# Patient Record
Sex: Male | Born: 1965
Health system: Southern US, Community
[De-identification: ages and names within clinical notes are randomized; demographics above are authoritative.]

## PROBLEM LIST (undated history)

## (undated) DIAGNOSIS — I10 Essential (primary) hypertension: Secondary | ICD-10-CM

## (undated) HISTORY — DX: Essential (primary) hypertension: I10

---

## 2005-10-26 ENCOUNTER — Emergency Department (HOSPITAL_COMMUNITY): Admission: EM | Admit: 2005-10-26 | Discharge: 2005-10-26 | Payer: Self-pay | Admitting: Emergency Medicine

## 2009-12-21 ENCOUNTER — Emergency Department (HOSPITAL_COMMUNITY): Admission: EM | Admit: 2009-12-21 | Discharge: 2009-12-21 | Payer: Self-pay | Admitting: Emergency Medicine

## 2010-10-05 LAB — URINALYSIS, ROUTINE W REFLEX MICROSCOPIC
Bilirubin Urine: NEGATIVE
Ketones, ur: 15 mg/dL — AB
Leukocytes, UA: NEGATIVE
Specific Gravity, Urine: 1.026 (ref 1.005–1.030)

## 2010-10-05 LAB — POCT I-STAT, CHEM 8
BUN: 29 mg/dL — ABNORMAL HIGH (ref 6–23)
Chloride: 106 mEq/L (ref 96–112)
HCT: 48 % (ref 39.0–52.0)
Hemoglobin: 16.3 g/dL (ref 13.0–17.0)
Sodium: 139 mEq/L (ref 135–145)

## 2011-05-30 IMAGING — CT CT ABD-PELV W/O CM
2 of 4 series · 17 of 46 positions shown, 19 images · non-contrast
Comparison: None.

CLINICAL DATA: Acute right flank pain with vomiting.  History of
kidney stones.

CT ABDOMEN AND PELVIS WITHOUT CONTRAST
TECHNIQUE: Multidetector CT imaging of the abdomen and pelvis was
performed following the standard protocol without intravenous
contrast.

[Series 2: a/p w/o 5.0 b31f st · axial · non-contrast · 0.71mm/px · z∈[-448,-8]mm · 14 of 98 slices shown, 16 images]
[im 5/98  soft-tissue]
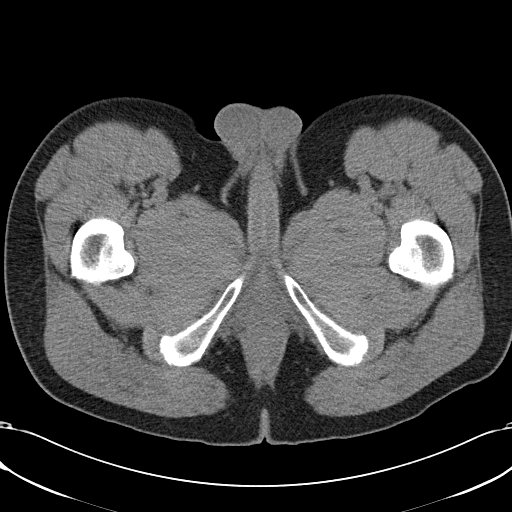
[im 5/98  bone]
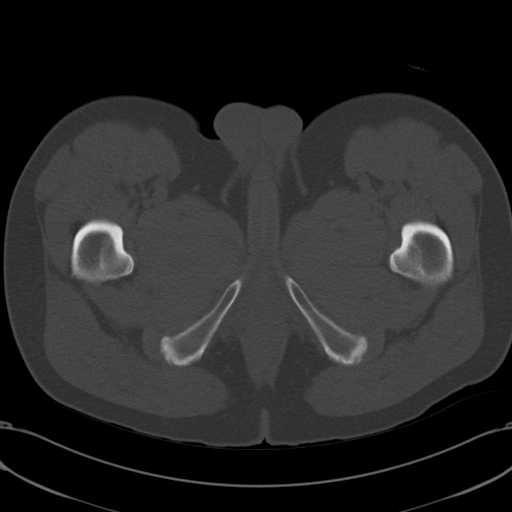
[im 13/98  soft-tissue]
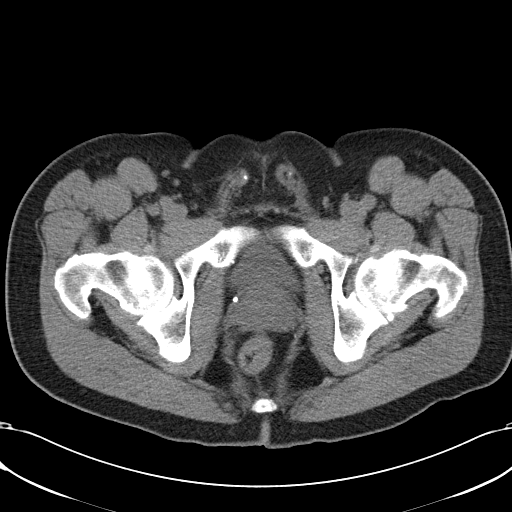
[im 17/98  soft-tissue]
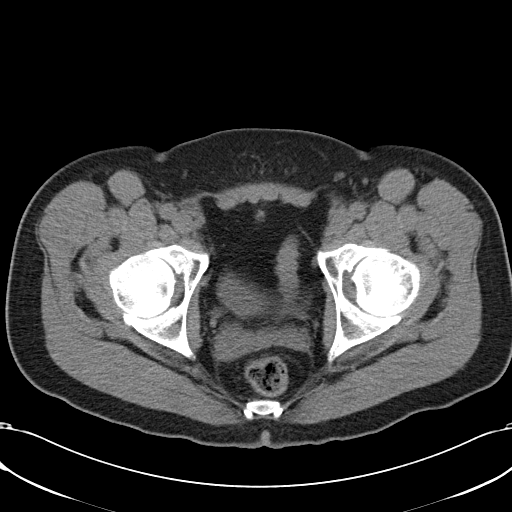
[im 26/98  soft-tissue]
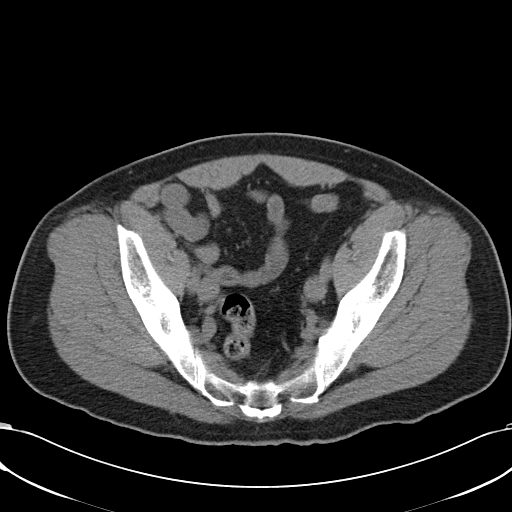
[im 34/98  soft-tissue]
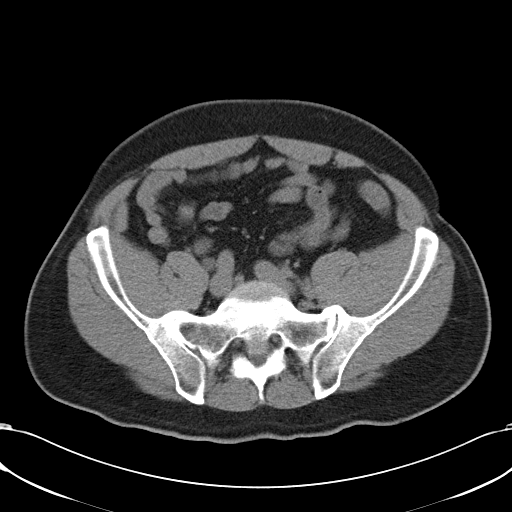
[im 38/98  soft-tissue]
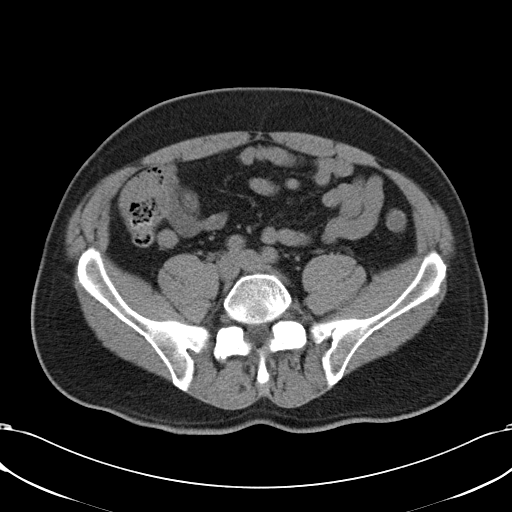
[im 47/98  soft-tissue]
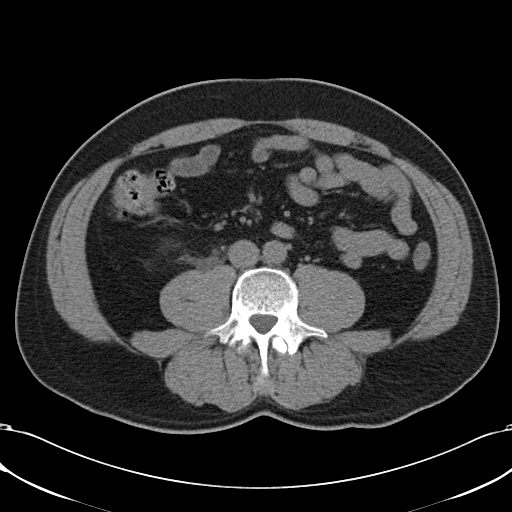
[im 51/98  soft-tissue]
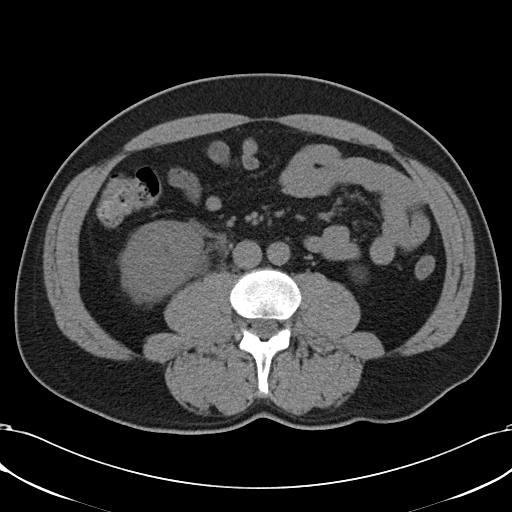
[im 60/98  soft-tissue]
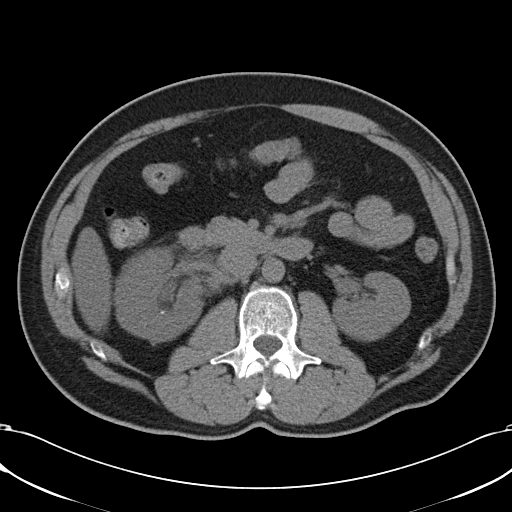
[im 60/98  bone]
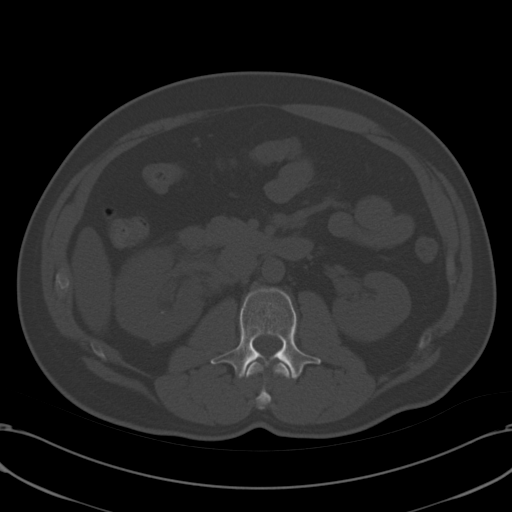
[im 64/98  soft-tissue]
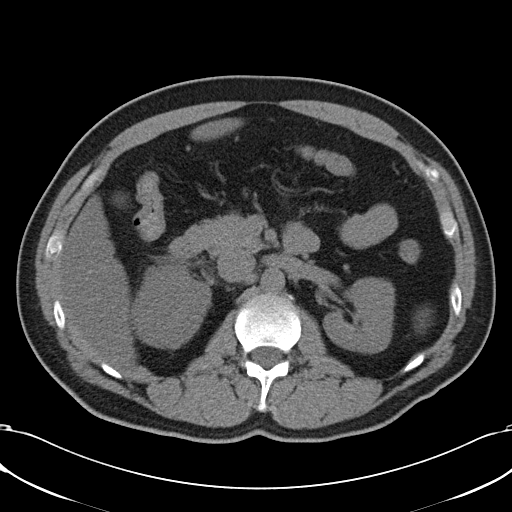
[im 72/98  soft-tissue]
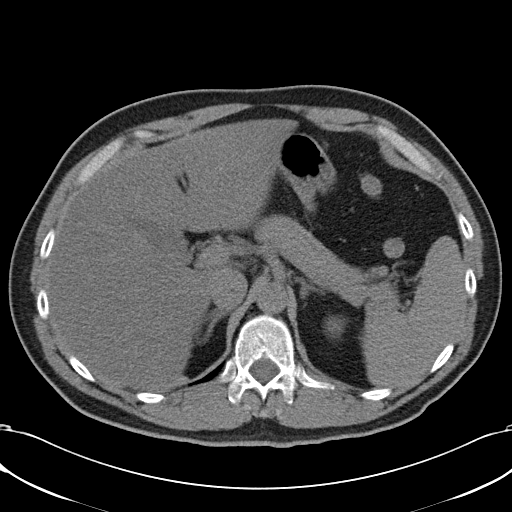
[im 81/98  soft-tissue]
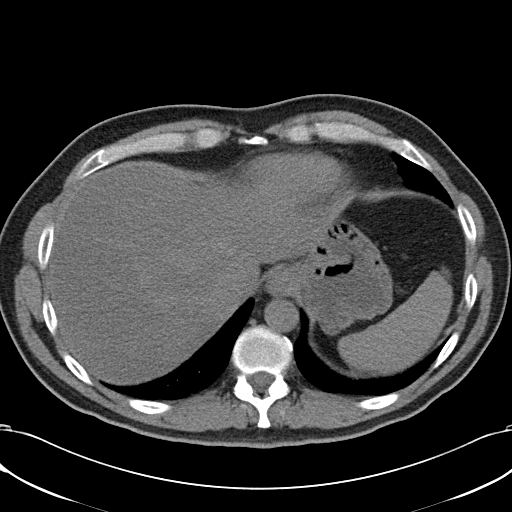
[im 85/98  soft-tissue]
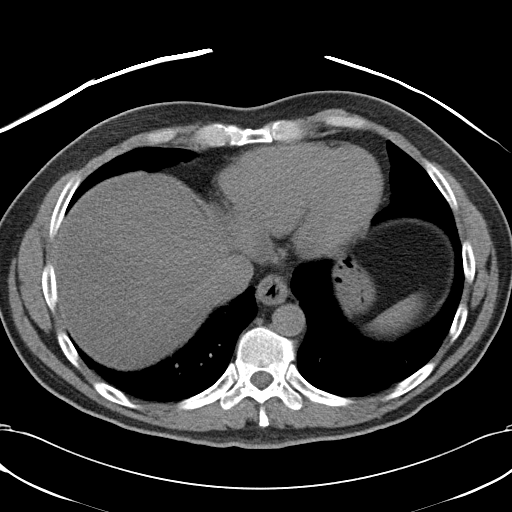
[im 93/98  soft-tissue]
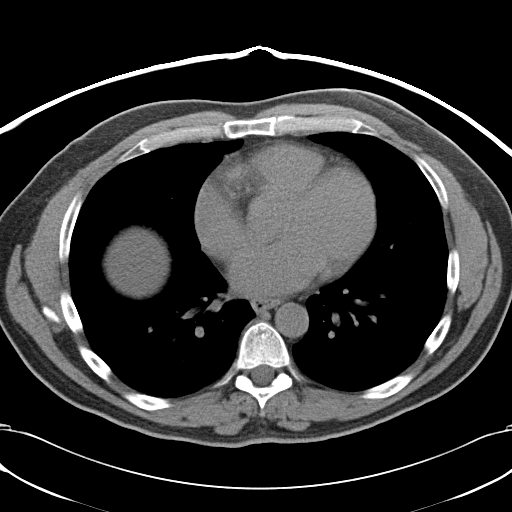

[Series 602: cor · coronal · 0.96mm/px · 3 of 79 slices shown]
[im 27/79  soft-tissue]
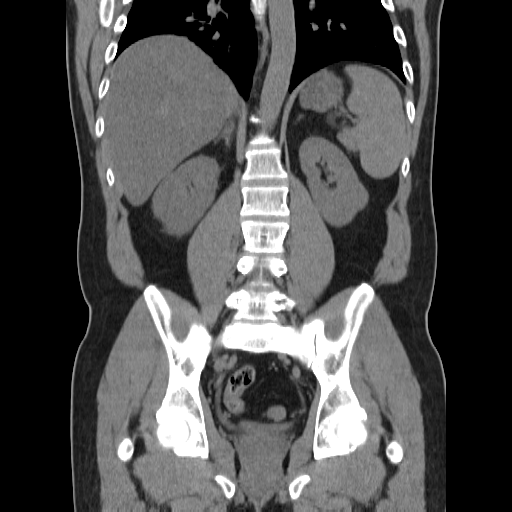
[im 35/79  soft-tissue]
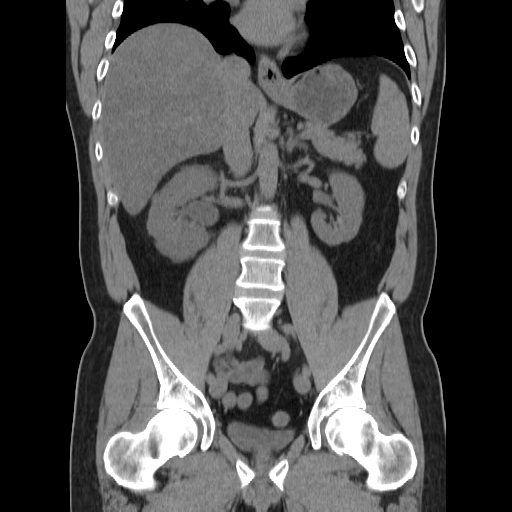
[im 44/79  soft-tissue]
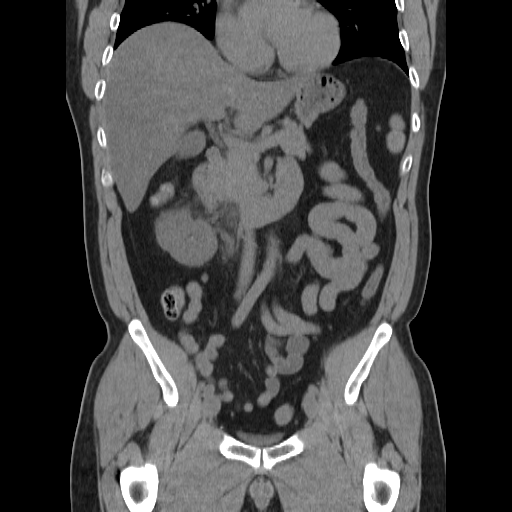

[17 of 46 positions shown; findings below may reference images not displayed]

FINDINGS: There is mild scarring or atelectasis in both lung bases.
There are small nonobstructing right renal calculi.  The right
kidney demonstrates moderate hydronephrosis and perinephric soft
tissue stranding.  There is a 3 mm calculus of the right ureteral
vesicle junction on image 83.  This protrudes into the bladder
lumen.  It is not clearly seen on the scout image.  There is no
left-sided hydronephrosis.  No focal renal abnormalities are
identified.

There is diffuse hepatic steatosis.  No focal liver lesions are
identified on noncontrast imaging. The remainder of the visualized
abdomen appears unremarkable as imaged in the noncontrast state,
but as such is suboptimally evaluated.
IMPRESSION: 1.  Obstructing 3 mm calculus at the right ureteral vesicle
junction.
2.  Additional small right renal calculi are present.
3.  Diffuse hepatic steatosis.

## 2013-06-08 ENCOUNTER — Ambulatory Visit (INDEPENDENT_AMBULATORY_CARE_PROVIDER_SITE_OTHER): Payer: 59 | Admitting: Family Medicine

## 2013-06-08 ENCOUNTER — Encounter (INDEPENDENT_AMBULATORY_CARE_PROVIDER_SITE_OTHER): Payer: Self-pay

## 2013-06-08 ENCOUNTER — Encounter: Payer: Self-pay | Admitting: Family Medicine

## 2013-06-08 VITALS — BP 143/93 | HR 99 | Temp 98.6°F | Ht 67.0 in | Wt 182.4 lb

## 2013-06-08 DIAGNOSIS — I10 Essential (primary) hypertension: Secondary | ICD-10-CM

## 2013-06-08 MED ORDER — HYDROCHLOROTHIAZIDE 25 MG PO TABS: 25.0000 mg | ORAL_TABLET | Freq: Every day | ORAL | Status: DC

## 2013-06-08 NOTE — Progress Notes (Signed)
  Subjective:    Patient ID: Dylan Ortiz, male    DOB: Apr 02, 1966, 47 y.o.   MRN: [id number]  HPI  This 47 y.o. male presents for evaluation of needing a med refill.  Review of Systems No chest pain, SOB, HA, dizziness, vision change, N/V, diarrhea, constipation, dysuria, urinary urgency or frequency, myalgias, arthralgias or rash.     Objective:   Physical Exam Vital signs noted  Well developed well nourished male.  HEENT - Head atraumatic Normocephalic                Eyes - PERRLA, Conjuctiva - clear Sclera- Clear EOMI                Ears - EAC's Wnl TM's Wnl Gross Hearing WNL                Nose - Nares patent                 Throat - oropharanx wnl Respiratory - Lungs CTA bilateral Cardiac - RRR S1 and S2 without murmur GI - Abdomen soft Nontender and bowel sounds active x 4 Extremities - No edema. Neuro - Grossly intact.       Assessment & Plan:  Hypertension - HCTZ 25mg  one po qd #30w/11 rf Follow up in one year  Deatra Canter FNP

## 2014-06-24 ENCOUNTER — Other Ambulatory Visit: Payer: Self-pay | Admitting: Family Medicine

## 2014-07-17 ENCOUNTER — Ambulatory Visit (INDEPENDENT_AMBULATORY_CARE_PROVIDER_SITE_OTHER): Payer: 59 | Admitting: Family Medicine

## 2014-07-17 VITALS — BP 116/69 | HR 63 | Temp 97.2°F | Ht 67.0 in | Wt 170.0 lb

## 2014-07-17 DIAGNOSIS — I10 Essential (primary) hypertension: Secondary | ICD-10-CM

## 2014-07-17 MED ORDER — HYDROCHLOROTHIAZIDE 25 MG PO TABS: 25.0000 mg | ORAL_TABLET | Freq: Every day | ORAL | Status: DC

## 2014-07-17 NOTE — Progress Notes (Signed)
   Subjective:    Patient ID: Dylan Ortiz, male    DOB: 09/24/1965, 48 y.o.   MRN: [id number]  HPI Patient is here for follow up.  He is taking HCTZ and needs refill.  He has lost weight and his bp runs 90's systolic at home when he checks it.  He has had labs at work and Lipid panel shows total chol of 134, HDL 56 and LDL - 67.  Trigs are wnl.  CMP is wnl, PSA is 0.4 and CBC normal.  He does not want colonoscopy.  He has no acute complaints.  Review of Systems  Constitutional: Negative for fever.  HENT: Negative for ear pain.   Eyes: Negative for discharge.  Respiratory: Negative for cough.   Cardiovascular: Negative for chest pain.  Gastrointestinal: Negative for abdominal distention.  Endocrine: Negative for polyuria.  Genitourinary: Negative for difficulty urinating.  Musculoskeletal: Negative for gait problem and neck pain.  Skin: Negative for color change and rash.  Neurological: Negative for speech difficulty and headaches.  Psychiatric/Behavioral: Negative for agitation.       Objective:    BP 116/69 mmHg  Pulse 63  Temp(Src) 97.2 F (36.2 C) (Oral)  Ht 5\' 7"  (1.702 m)  Wt 170 lb (77.111 kg)  BMI 26.62 kg/m2 Physical Exam  Constitutional: He is oriented to person, place, and time. He appears well-developed and well-nourished.  HENT:  Head: Normocephalic and atraumatic.  Mouth/Throat: Oropharynx is clear and moist.  Eyes: Pupils are equal, round, and reactive to light.  Neck: Normal range of motion. Neck supple.  Cardiovascular: Normal rate and regular rhythm.   No murmur heard. Pulmonary/Chest: Effort normal and breath sounds normal.  Abdominal: Soft. Bowel sounds are normal. There is no tenderness.  Neurological: He is alert and oriented to person, place, and time.  Skin: Skin is warm and dry.  Psychiatric: He has a normal mood and affect.          Assessment & Plan:     ICD-9-CM ICD-10-CM   1. Essential hypertension 401.9 I10 hydrochlorothiazide  (HYDRODIURIL) 25 MG tablet   Cut HCTZ in half and take bp in 3-4 weeks and need bp to be below 140/90 if not then go back to one po qd.  Return in about 1 year (around 07/18/2015).  Deatra CanterWilliam J Assyria Morreale FNP

## 2014-11-26 ENCOUNTER — Ambulatory Visit (INDEPENDENT_AMBULATORY_CARE_PROVIDER_SITE_OTHER): Payer: 59 | Admitting: Family Medicine

## 2014-11-26 ENCOUNTER — Encounter: Payer: Self-pay | Admitting: Family Medicine

## 2014-11-26 VITALS — BP 117/67 | HR 71 | Temp 97.2°F | Ht 67.0 in | Wt 170.0 lb

## 2014-11-26 DIAGNOSIS — K649 Unspecified hemorrhoids: Secondary | ICD-10-CM | POA: Diagnosis not present

## 2014-11-26 DIAGNOSIS — K644 Residual hemorrhoidal skin tags: Secondary | ICD-10-CM

## 2014-11-26 MED ORDER — NYSTATIN-TRIAMCINOLONE 100000-0.1 UNIT/GM-% EX OINT: 1.0000 "application " | TOPICAL_OINTMENT | Freq: Two times a day (BID) | CUTANEOUS | Status: DC

## 2014-11-26 NOTE — Progress Notes (Signed)
   Subjective:    Patient ID: Dylan Ortiz, male    DOB: 11/03/1965, 49 y.o.   MRN: [id number]  HPI 49 year old gentleman here with hemorrhoidal symptoms. He has not had pain or bleeding but his symptoms are mostly itching, much worse at night. He reports that last night he was awakened it 2 or 3:00 and has been awake since then with itching. He denies any problems with diarrhea or constipation. He has tried several over-the-counter medicines without relief. What has afforded some relief has been an ice cube.    Review of Systems  Constitutional: Negative.   HENT: Negative.   Eyes: Negative.   Respiratory: Negative.  Negative for shortness of breath.   Cardiovascular: Negative.  Negative for chest pain and leg swelling.  Gastrointestinal: Negative.  Abdominal distention: rectal pruritus.       Rectal pruritus  Genitourinary: Negative.   Musculoskeletal: Negative.   Skin: Negative.   Neurological: Negative.   Psychiatric/Behavioral: Negative.   All other systems reviewed and are negative.  There are no active problems to display for this patient.  Outpatient Encounter Prescriptions as of 11/26/2014  Medication Sig  . hydrochlorothiazide (HYDRODIURIL) 25 MG tablet Take 1 tablet (25 mg total) by mouth daily.  Marland Kitchen. nystatin-triamcinolone ointment (MYCOLOG) Apply 1 application topically 2 (two) times daily.   No facility-administered encounter medications on file as of 11/26/2014.       Objective:   Physical Exam  Constitutional: He appears well-developed and well-nourished.  Genitourinary:  There is general inflammation of the rectal mucosa as well as inflamed hemorrhoidal tag. Internal exam reveals no internal hemorrhoids.          Assessment & Plan:  1. Hemorrhoids, unspecified hemorrhoid type This patient has hemorrhoidal skin tag and pruritus pain. He denies use of Sinemet or colored toilet paper. I would like to refer him to gastroenterology for the possibility of banding but  also to consider colonoscopy. Rx Mycolog ointment to be used twice a day. May also use Benadryl or antihistamine at bedtime for - Ambulatory referral to Gastroenterology   Frederica KusterStephen M Miller MD

## 2014-12-03 ENCOUNTER — Telehealth: Payer: Self-pay | Admitting: Family Medicine

## 2015-03-18 ENCOUNTER — Ambulatory Visit (INDEPENDENT_AMBULATORY_CARE_PROVIDER_SITE_OTHER): Payer: 59 | Admitting: Family Medicine

## 2015-03-18 ENCOUNTER — Ambulatory Visit (INDEPENDENT_AMBULATORY_CARE_PROVIDER_SITE_OTHER): Payer: 59

## 2015-03-18 ENCOUNTER — Encounter: Payer: Self-pay | Admitting: Family Medicine

## 2015-03-18 VITALS — BP 123/79 | HR 74 | Temp 98.7°F | Ht 67.0 in | Wt 176.0 lb

## 2015-03-18 DIAGNOSIS — M79641 Pain in right hand: Secondary | ICD-10-CM

## 2015-03-18 NOTE — Progress Notes (Signed)
   Subjective:    Patient ID: Dylan Ortiz, male    DOB: November 16, 1965, 49 y.o.   MRN: [id number]  HPI 49 year old gentleman who was pulling on some material today and felt a pop in the dorsum of his right hand. After that he had some swelling and he can now feel "something moving in the top of his hand.". There are no active problems to display for this patient.  Outpatient Encounter Prescriptions as of 03/18/2015  Medication Sig  . hydrochlorothiazide (HYDRODIURIL) 25 MG tablet Take 1 tablet (25 mg total) by mouth daily.  Marland Kitchen nystatin-triamcinolone ointment (MYCOLOG) Apply 1 application topically 2 (two) times daily.   No facility-administered encounter medications on file as of 03/18/2015.        Review of Systems  Constitutional: Negative.   HENT: Negative.   Eyes: Negative.   Respiratory: Negative.  Negative for shortness of breath.   Cardiovascular: Negative.  Negative for chest pain and leg swelling.  Gastrointestinal: Negative.   Genitourinary: Negative.   Musculoskeletal: Negative.   Skin: Negative.   Neurological: Negative.   Psychiatric/Behavioral: Negative.   All other systems reviewed and are negative.      Objective:   Physical Exam  Constitutional: He appears well-developed and well-nourished.  Musculoskeletal:  Right hand: There is minimal soft tissue swelling on the dorsum. As patient flexes and extends his fingers there is crepitus felt that he has good strength especially flexion in all his fingers when tested together and individually.  X-ray shows no evidence of bony injury.    BP 123/79 mmHg  Pulse 74  Temp(Src) 98.7 F (37.1 C) (Oral)  Ht  (1.702 m)  Wt 176 lb (79.833 kg)  BMI 27.56 kg/m2       Assessment & Plan:  1. Pain of right hand Given history and exam I suspect this is tendon strain and secondary inflammation and soft tissue swelling. I have instructed him to use the hand as tolerated and to stop if what ever he is doing causes pain.  He may use some ibuprofen as needed.  Frederica Kuster MD - DG Hand Complete Right; Future

## 2015-07-05 ENCOUNTER — Other Ambulatory Visit: Payer: Self-pay | Admitting: *Deleted

## 2015-07-05 DIAGNOSIS — I10 Essential (primary) hypertension: Secondary | ICD-10-CM

## 2015-07-05 MED ORDER — HYDROCHLOROTHIAZIDE 25 MG PO TABS: 25.0000 mg | ORAL_TABLET | Freq: Every day | ORAL | Status: DC

## 2015-07-11 ENCOUNTER — Ambulatory Visit (INDEPENDENT_AMBULATORY_CARE_PROVIDER_SITE_OTHER): Payer: 59 | Admitting: Family Medicine

## 2015-07-11 ENCOUNTER — Encounter: Payer: Self-pay | Admitting: Family Medicine

## 2015-07-11 VITALS — BP 151/97 | HR 68 | Temp 97.3°F | Ht 67.0 in | Wt 169.2 lb

## 2015-07-11 DIAGNOSIS — K641 Second degree hemorrhoids: Secondary | ICD-10-CM

## 2015-07-11 DIAGNOSIS — I10 Essential (primary) hypertension: Secondary | ICD-10-CM | POA: Diagnosis not present

## 2015-07-11 MED ORDER — NYSTATIN-TRIAMCINOLONE 100000-0.1 UNIT/GM-% EX OINT: 1.0000 "application " | TOPICAL_OINTMENT | Freq: Two times a day (BID) | CUTANEOUS | Status: DC

## 2015-07-11 MED ORDER — HYDROCHLOROTHIAZIDE 25 MG PO TABS: 25.0000 mg | ORAL_TABLET | Freq: Every day | ORAL | Status: DC

## 2015-07-11 NOTE — Progress Notes (Signed)
BP 151/97 mmHg  Pulse 68  Temp(Src) 97.3 F (36.3 C) (Oral)  Ht  (1.702 m)  Wt 169 lb 3.2 oz (76.749 kg)  BMI 26.49 kg/m2   Subjective:    Patient ID: Dylan Ortiz, male    DOB: [birth year], 49 y.o.   MRN: [id number]  HPI: [name] is a 49 y.o. male presenting on 07/11/2015 for Medication Refill; Hemorrhoids; and Hypertension   HPI Hypertension Patient presents today recheck on his hypertension. He has a home blood pressure machine and has been checking it and is been consistently up over 140 and 150. This is been going on over the past 2 months. Normally he takes hydrochlorothiazide 12.5 mg which is half of what his pillows. Over the past few months he has been taking the full dose of 25 mg. He denies any chest pain or headaches or blurred vision. He has been having a lot of stress recently and has increased his caffeine intake recently. He denies any other lifestyle changes recently except that he did get a divorce about 5 or 6 months ago.  Hemorrhoids Patient has had hemorrhoids for quite some time and is currently using his triamcinolone cream for that. It is helping some but he is also developing an inflammation around the area. He is concerned that it might be more hemorrhoids or swelling. He has not had any bleeding recently from the hemorrhoid.  Relevant past medical, surgical, family and social history reviewed and updated as indicated. Interim medical history since our last visit reviewed. Allergies and medications reviewed and updated.  Review of Systems  Constitutional: Negative for fever and chills.  HENT: Negative for ear discharge and ear pain.   Eyes: Negative for discharge and visual disturbance.  Respiratory: Negative for shortness of breath and wheezing.   Cardiovascular: Negative for chest pain and leg swelling.  Gastrointestinal: Negative for abdominal pain, diarrhea and constipation.       Hemorrhoids  Genitourinary: Negative for difficulty urinating.    Musculoskeletal: Negative for back pain and gait problem.  Skin: Negative for rash.  Neurological: Negative for dizziness, syncope, light-headedness and headaches.  All other systems reviewed and are negative.   Per HPI unless specifically indicated above     Medication List       This list is accurate as of: 07/11/15  8:35 AM.  Always use your most recent med list.               hydrochlorothiazide 25 MG tablet  Commonly known as:  HYDRODIURIL  Take 1 tablet (25 mg total) by mouth daily.     nystatin-triamcinolone ointment  Commonly known as:  MYCOLOG  Apply 1 application topically 2 (two) times daily.           Objective:    BP 151/97 mmHg  Pulse 68  Temp(Src) 97.3 F (36.3 C) (Oral)  Ht  (1.702 m)  Wt 169 lb 3.2 oz (76.749 kg)  BMI 26.49 kg/m2  Wt Readings from Last 3 Encounters:  07/11/15 169 lb 3.2 oz (76.749 kg)  03/18/15 176 lb (79.833 kg)  11/26/14 170 lb (77.111 kg)    Physical Exam  Constitutional: He is oriented to person, place, and time. He appears well-developed and well-nourished. No distress.  Eyes: Conjunctivae and EOM are normal. Pupils are equal, round, and reactive to light. Right eye exhibits no discharge. No scleral icterus.  Neck: Neck supple. No thyromegaly present.  Cardiovascular: Normal rate, regular rhythm, normal heart  sounds and intact distal pulses.   No murmur heard. Pulmonary/Chest: Effort normal and breath sounds normal. No respiratory distress. He has no wheezes.  Abdominal: He exhibits no distension. There is no tenderness. There is no rebound.  Genitourinary: Rectum normal and testes normal. Cremasteric reflex is present.     Musculoskeletal: Normal range of motion. He exhibits no edema.  Lymphadenopathy:    He has no cervical adenopathy.  Neurological: He is alert and oriented to person, place, and time. Coordination normal.  Skin: Skin is warm and dry. No rash noted. He is not diaphoretic.  Psychiatric: He has  a normal mood and affect. His behavior is normal.  Vitals reviewed.   Results for orders placed or performed during the hospital encounter of 12/21/09  Urinalysis, Routine w reflex microscopic  Result Value Ref Range   Color, Urine YELLOW YELLOW   APPearance CLEAR CLEAR   Specific Gravity, Urine 1.026 1.005 - 1.030   pH 5.0 5.0 - 8.0   Glucose, UA NEGATIVE NEGATIVE mg/dL   Hgb urine dipstick SMALL (A) NEGATIVE   Bilirubin Urine NEGATIVE NEGATIVE   Ketones, ur 15 (A) NEGATIVE mg/dL   Protein, ur NEGATIVE NEGATIVE mg/dL   Urobilinogen, UA 0.2 0.0 - 1.0 mg/dL   Nitrite NEGATIVE NEGATIVE   Leukocytes, UA NEGATIVE NEGATIVE  Urine microscopic-add on  Result Value Ref Range   Squamous Epithelial / LPF RARE RARE   Bacteria, UA FEW (A) RARE   Urine-Other MUCOUS PRESENT   I-STAT, chem 8  Result Value Ref Range   Sodium 139 135 - 145 mEq/L   Potassium 3.5 3.5 - 5.1 mEq/L   Chloride 106 96 - 112 mEq/L   BUN 29 (H) 6 - 23 mg/dL   Creatinine, Ser 1.3 0.4 - 1.5 mg/dL   Glucose, Bld 161160 (H) 70 - 99 mg/dL   Calcium, Ion 0.961.17 0.451.12 - 1.32 mmol/L   TCO2 24 0 - 100 mmol/L   Hemoglobin 16.3 13.0 - 17.0 g/dL   HCT 40.948.0 81.139.0 - 91.452.0 %      Assessment & Plan:   Problem List Items Addressed This Visit      Cardiovascular and Mediastinum   Essential hypertension, benign - Primary   Relevant Medications   hydrochlorothiazide (HYDRODIURIL) 25 MG tablet    Other Visit Diagnoses    Second degree hemorrhoids        Relevant Medications    hydrochlorothiazide (HYDRODIURIL) 25 MG tablet    nystatin-triamcinolone ointment (MYCOLOG)        Follow up plan: Return in about 4 weeks (around 08/08/2015), or if symptoms worsen or fail to improve, for Htn f/u.  Counseling provided for all of the vaccine components No orders of the defined types were placed in this encounter.    Arville CareJoshua Liliah Dorian, MD [facility] 07/11/2015, 8:35 AM

## 2015-07-23 ENCOUNTER — Encounter: Payer: Self-pay | Admitting: Family Medicine

## 2015-08-08 ENCOUNTER — Ambulatory Visit (INDEPENDENT_AMBULATORY_CARE_PROVIDER_SITE_OTHER): Payer: 59 | Admitting: Family Medicine

## 2015-08-08 ENCOUNTER — Encounter: Payer: Self-pay | Admitting: Family Medicine

## 2015-08-08 VITALS — BP 123/77 | HR 66 | Temp 97.7°F | Ht 67.0 in | Wt 172.0 lb

## 2015-08-08 DIAGNOSIS — K641 Second degree hemorrhoids: Secondary | ICD-10-CM | POA: Diagnosis not present

## 2015-08-08 DIAGNOSIS — I10 Essential (primary) hypertension: Secondary | ICD-10-CM

## 2015-08-08 MED ORDER — NYSTATIN-TRIAMCINOLONE 100000-0.1 UNIT/GM-% EX OINT: 1.0000 "application " | TOPICAL_OINTMENT | Freq: Two times a day (BID) | CUTANEOUS | Status: DC

## 2015-08-08 MED ORDER — HYDROCHLOROTHIAZIDE 25 MG PO TABS: 25.0000 mg | ORAL_TABLET | Freq: Every day | ORAL | Status: DC

## 2015-08-08 NOTE — Progress Notes (Signed)
BP 123/77 mmHg  Pulse 66  Temp(Src) 97.7 F (36.5 C) (Oral)  Ht  (1.702 m)  Wt 172 lb (78.019 kg)  BMI 26.93 kg/m2   Subjective:    Patient ID: Dylan Ortiz, male    DOB: 1966/03/04, 50 y.o.   MRN: [id number]  HPI: Dylan Ortiz is a 50 y.o. male presenting on 08/08/2015 for Hypertension and Medication Refill   HPI Hypertension Patient is coming in today for a four-week hypertension recheck. He was started on hydrochlorothiazide 25 mg daily. His blood pressure today is controlled at 123/77. Patient denies headaches, blurred vision, chest pains, shortness of breath, or weakness. Denies any side effects from medication and is content with current medication.   Hemorrhoids Patient has been using a Mycolog cream for his hemorrhoids and his jock itch and it has been working well when he uses it intermittently when it flares up. He would like a refill for today. He is not having issues with him today.  Relevant past medical, surgical, family and social history reviewed and updated as indicated. Interim medical history since our last visit reviewed. Allergies and medications reviewed and updated.  Review of Systems  Constitutional: Negative for fever and chills.  HENT: Negative for ear discharge and ear pain.   Eyes: Negative for discharge and visual disturbance.  Respiratory: Negative for shortness of breath and wheezing.   Cardiovascular: Negative for chest pain and leg swelling.  Gastrointestinal: Negative for abdominal pain, diarrhea and constipation.  Genitourinary: Negative for difficulty urinating.  Musculoskeletal: Negative for back pain and gait problem.  Skin: Negative for rash.  Neurological: Negative for dizziness, syncope, light-headedness and headaches.  All other systems reviewed and are negative.   Per HPI unless specifically indicated above     Medication List       This list is accurate as of: 08/08/15  4:54 PM.  Always use your most recent med list.               hydrochlorothiazide 25 MG tablet  Commonly known as:  HYDRODIURIL  Take 1 tablet (25 mg total) by mouth daily.     nystatin-triamcinolone ointment  Commonly known as:  MYCOLOG  Apply 1 application topically 2 (two) times daily.           Objective:    BP 123/77 mmHg  Pulse 66  Temp(Src) 97.7 F (36.5 C) (Oral)  Ht  (1.702 m)  Wt 172 lb (78.019 kg)  BMI 26.93 kg/m2  Wt Readings from Last 3 Encounters:  08/08/15 172 lb (78.019 kg)  07/11/15 169 lb 3.2 oz (76.749 kg)  03/18/15 176 lb (79.833 kg)    Physical Exam  Constitutional: He is oriented to person, place, and time. He appears well-developed and well-nourished. No distress.  Eyes: Conjunctivae and EOM are normal. Pupils are equal, round, and reactive to light. Right eye exhibits no discharge. No scleral icterus.  Neck: Neck supple. No thyromegaly present.  Cardiovascular: Normal rate, regular rhythm, normal heart sounds and intact distal pulses.   No murmur heard. Pulmonary/Chest: Effort normal and breath sounds normal. No respiratory distress. He has no wheezes.  Musculoskeletal: Normal range of motion. He exhibits no edema.  Lymphadenopathy:    He has no cervical adenopathy.  Neurological: He is alert and oriented to person, place, and time. Coordination normal.  Skin: Skin is warm and dry. No rash noted. He is not diaphoretic.  Psychiatric: He has a normal mood and affect. His behavior  is normal.  Nursing note and vitals reviewed.      Assessment & Plan:   Problem List Items Addressed This Visit      Cardiovascular and Mediastinum   Essential hypertension, benign - Primary   Relevant Medications   hydrochlorothiazide (HYDRODIURIL) 25 MG tablet    Other Visit Diagnoses    Second degree hemorrhoids        Relevant Medications    hydrochlorothiazide (HYDRODIURIL) 25 MG tablet    nystatin-triamcinolone ointment (MYCOLOG)        Follow up plan: Return in about 6 months (around  02/05/2016), or if symptoms worsen or fail to improve, for htn.  Counseling provided for all of the vaccine components No orders of the defined types were placed in this encounter.    Arville Care, MD [facility] 08/08/2015, 4:54 PM

## 2015-08-13 ENCOUNTER — Encounter: Payer: Self-pay | Admitting: Family Medicine

## 2015-10-24 ENCOUNTER — Ambulatory Visit (INDEPENDENT_AMBULATORY_CARE_PROVIDER_SITE_OTHER): Payer: 59 | Admitting: Family Medicine

## 2015-10-24 VITALS — BP 125/76 | HR 68 | Temp 97.0°F | Ht 67.0 in | Wt 177.0 lb

## 2015-10-24 DIAGNOSIS — H6192 Disorder of left external ear, unspecified: Secondary | ICD-10-CM | POA: Diagnosis not present

## 2015-10-24 NOTE — Progress Notes (Signed)
Subjective:  Patient ID: Dylan Ortiz, male    DOB: 08/03/1965  Age: 50 y.o. MRN: [id number]  CC: ear lesion   HPI Dylan BailGary W Ortiz presents for lesion on left ear. Noted 3 weeks ago. Not painful, but bled a little so he decided to get it checked.   History Dylan Ortiz has a past medical history of Hypertension.   He has no past surgical history on file.   His family history includes Alzheimer's disease in his paternal grandfather; Early death in his paternal grandmother; Heart disease in his brother, father, and sister; Hyperlipidemia in his mother.He reports that he has never smoked. He does not have any smokeless tobacco history on file. He reports that he does not drink alcohol or use illicit drugs.    ROS Review of Systems  Constitutional: Negative for fever, activity change and appetite change.  HENT: Negative for ear pain and hearing loss.     Objective:  BP 125/76 mmHg  Pulse 68  Temp(Src) 97 F (36.1 C) (Oral)  Ht 5\' 7"  (1.702 m)  Wt 177 lb (80.287 kg)  BMI 27.72 kg/m2  SpO2 99%  BP Readings from Last 3 Encounters:  10/24/15 125/76  08/08/15 123/77  07/11/15 151/97    Wt Readings from Last 3 Encounters:  10/24/15 177 lb (80.287 kg)  08/08/15 172 lb (78.019 kg)  07/11/15 169 lb 3.2 oz (76.749 kg)     Physical Exam  Constitutional: He is oriented to person, place, and time. He appears well-developed and well-nourished. No distress.  Neurological: He is alert and oriented to person, place, and time. No cranial nerve deficit.  Skin: Skin is warm and dry.  3 mm flesh colored, pearescent raised lesion on tragus of left auricle   Psychiatric: He has a normal mood and affect. His behavior is normal.     Lab Results  Component Value Date   HGB 16.3 12/21/2009   HCT 48.0 12/21/2009   GLUCOSE 160* 12/21/2009   NA 139 12/21/2009   K 3.5 12/21/2009   CL 106 12/21/2009   CREATININE 1.3 12/21/2009   BUN 29* 12/21/2009    Ct Abdomen Pelvis Wo Contrast  12/21/2009   Clinical Data: Acute right flank pain with vomiting.  History of kidney stones.  CT ABDOMEN AND PELVIS WITHOUT CONTRAST  Technique:  Multidetector CT imaging of the abdomen and pelvis was performed following the standard protocol without intravenous contrast.  Comparison: None.  Findings: There is mild scarring or atelectasis in both lung bases. There are small nonobstructing right renal calculi.  The right kidney demonstrates moderate hydronephrosis and perinephric soft tissue stranding.  There is a 3 mm calculus of the right ureteral vesicle junction on image 83.  This protrudes into the bladder lumen.  It is not clearly seen on the scout image.  There is no left-sided hydronephrosis.  No focal renal abnormalities are identified.  There is diffuse hepatic steatosis.  No focal liver lesions are identified on noncontrast imaging. The remainder of the visualized abdomen appears unremarkable as imaged in the noncontrast state, but as such is suboptimally evaluated.  IMPRESSION:  1.  Obstructing 3 mm calculus at the right ureteral vesicle junction. 2.  Additional small right renal calculi are present. 3.  Diffuse hepatic steatosis. Provider: [name]   Assessment & Plan:   Dylan Ortiz was seen today for ear lesion.  Diagnoses and all orders for this visit:  Lesion of external ear, left -     Ambulatory referral to  Dermatology      I am having Mr. Schnitzer maintain his hydrochlorothiazide and nystatin-triamcinolone ointment.  No orders of the defined types were placed in this encounter.     Follow-up: No Follow-up on file.  Mechele Claude, M.D.

## 2016-04-01 ENCOUNTER — Ambulatory Visit (INDEPENDENT_AMBULATORY_CARE_PROVIDER_SITE_OTHER): Payer: 59 | Admitting: Family Medicine

## 2016-04-01 ENCOUNTER — Encounter: Payer: Self-pay | Admitting: Family Medicine

## 2016-04-01 ENCOUNTER — Other Ambulatory Visit: Payer: Self-pay | Admitting: Family Medicine

## 2016-04-01 VITALS — BP 127/80 | HR 61 | Temp 97.8°F | Ht 67.0 in | Wt 170.4 lb

## 2016-04-01 DIAGNOSIS — K641 Second degree hemorrhoids: Secondary | ICD-10-CM

## 2016-04-01 DIAGNOSIS — Z0000 Encounter for general adult medical examination without abnormal findings: Secondary | ICD-10-CM

## 2016-04-01 DIAGNOSIS — I10 Essential (primary) hypertension: Secondary | ICD-10-CM

## 2016-04-01 MED ORDER — HYDROCHLOROTHIAZIDE 25 MG PO TABS: 25.0000 mg | ORAL_TABLET | Freq: Every day | ORAL | 4 refills | Status: DC

## 2016-04-01 MED ORDER — NYSTATIN-TRIAMCINOLONE 100000-0.1 UNIT/GM-% EX OINT: 1.0000 "application " | TOPICAL_OINTMENT | Freq: Two times a day (BID) | CUTANEOUS | 3 refills | Status: DC

## 2016-04-01 NOTE — Progress Notes (Signed)
BP 127/80   Pulse 61   Temp 97.8 F (36.6 C) (Oral)   Ht 5\' 7"  (1.702 m)   Wt 170 lb 6 oz (77.3 kg)   BMI 26.68 kg/m    Subjective:    Patient ID: Dylan BailGary W Flessner, male    DOB: 04/06/1966, 50 y.o.   MRN: [id number]  HPI: Dylan Ortiz is a 50 y.o. male presenting on 04/01/2016 for Medication Refill   HPI Adult well exam and hypertension recheck Patient comes in today for adult well exam and hypertension recheck. His blood pressure today is 127/80. He has been taking hydrochlorothiazide 25 mg half in the morning and half at night. It is doing well for him except for he does have to get up and urinate once a night. He denies any chest pain, shortness of breath, headaches or vision issues, abdominal complaints, diarrhea, nausea, vomiting, or joint issues.   Relevant past medical, surgical, family and social history reviewed and updated as indicated. Interim medical history since our last visit reviewed. Allergies and medications reviewed and updated.  Review of Systems  Constitutional: Negative for chills and fever.  HENT: Negative for ear pain and tinnitus.   Eyes: Negative for pain and discharge.  Respiratory: Negative for cough, shortness of breath and wheezing.   Cardiovascular: Negative for chest pain, palpitations and leg swelling.  Gastrointestinal: Negative for abdominal pain, blood in stool, constipation and diarrhea.  Genitourinary: Negative for dysuria and hematuria.  Musculoskeletal: Negative for back pain, gait problem and myalgias.  Skin: Negative for rash.  Neurological: Negative for dizziness, weakness and headaches.  Psychiatric/Behavioral: Negative for suicidal ideas.  All other systems reviewed and are negative.   Per HPI unless specifically indicated above     Medication List       Accurate as of 04/01/16  4:53 PM. Always use your most recent med list.          hydrochlorothiazide 25 MG tablet Commonly known as:  HYDRODIURIL Take 1 tablet (25 mg total)  by mouth daily.   nystatin-triamcinolone ointment Commonly known as:  MYCOLOG Apply 1 application topically 2 (two) times daily.          Objective:    BP 127/80   Pulse 61   Temp 97.8 F (36.6 C) (Oral)   Ht 5\' 7"  (1.702 m)   Wt 170 lb 6 oz (77.3 kg)   BMI 26.68 kg/m   Wt Readings from Last 3 Encounters:  04/01/16 170 lb 6 oz (77.3 kg)  10/24/15 177 lb (80.3 kg)  08/08/15 172 lb (78 kg)    Physical Exam  Constitutional: He is oriented to person, place, and time. He appears well-developed and well-nourished. No distress.  HENT:  Right Ear: External ear normal.  Left Ear: External ear normal.  Nose: Nose normal.  Mouth/Throat: Oropharynx is clear and moist. No oropharyngeal exudate.  Eyes: Conjunctivae and EOM are normal. Pupils are equal, round, and reactive to light. Right eye exhibits no discharge. No scleral icterus.  Neck: Neck supple. No thyromegaly present.  Cardiovascular: Normal rate, regular rhythm, normal heart sounds and intact distal pulses.   No murmur heard. Pulmonary/Chest: Effort normal and breath sounds normal. No respiratory distress. He has no wheezes.  Abdominal: Soft. Bowel sounds are normal. He exhibits no distension. There is no tenderness. There is no rebound and no guarding.  Musculoskeletal: Normal range of motion. He exhibits no edema.  Lymphadenopathy:    He has no cervical adenopathy.  Neurological:  He is alert and oriented to person, place, and time. Coordination normal.  Skin: Skin is warm and dry. No rash noted. He is not diaphoretic.  Psychiatric: He has a normal mood and affect. His behavior is normal.  Vitals reviewed.  Patient has labs that he brought from his workplace. They are all are within normal limits.    Assessment & Plan:   Problem List Items Addressed This Visit      Cardiovascular and Mediastinum   Essential hypertension, benign   Relevant Medications   hydrochlorothiazide (HYDRODIURIL) 25 MG tablet    Other Visit  Diagnoses    Well adult exam    -  Primary   Second degree hemorrhoids       Relevant Medications   nystatin-triamcinolone ointment (MYCOLOG)   hydrochlorothiazide (HYDRODIURIL) 25 MG tablet       Follow up plan: Return in about 1 year (around 04/01/2017), or if symptoms worsen or fail to improve.  Counseling provided for all of the vaccine components No orders of the defined types were placed in this encounter.   Arville Care, MD [facility] 04/01/2016, 4:53 PM

## 2016-08-24 IMAGING — CR DG HAND COMPLETE 3+V*R*
3 series · 3 of 3 positions shown · non-contrast
Comparison: None.

CLINICAL DATA: Injured hand 4 days ago with pain and swelling

EXAM:
RIGHT HAND - COMPLETE 3+ VIEW

[view not recorded (1 of 3)]
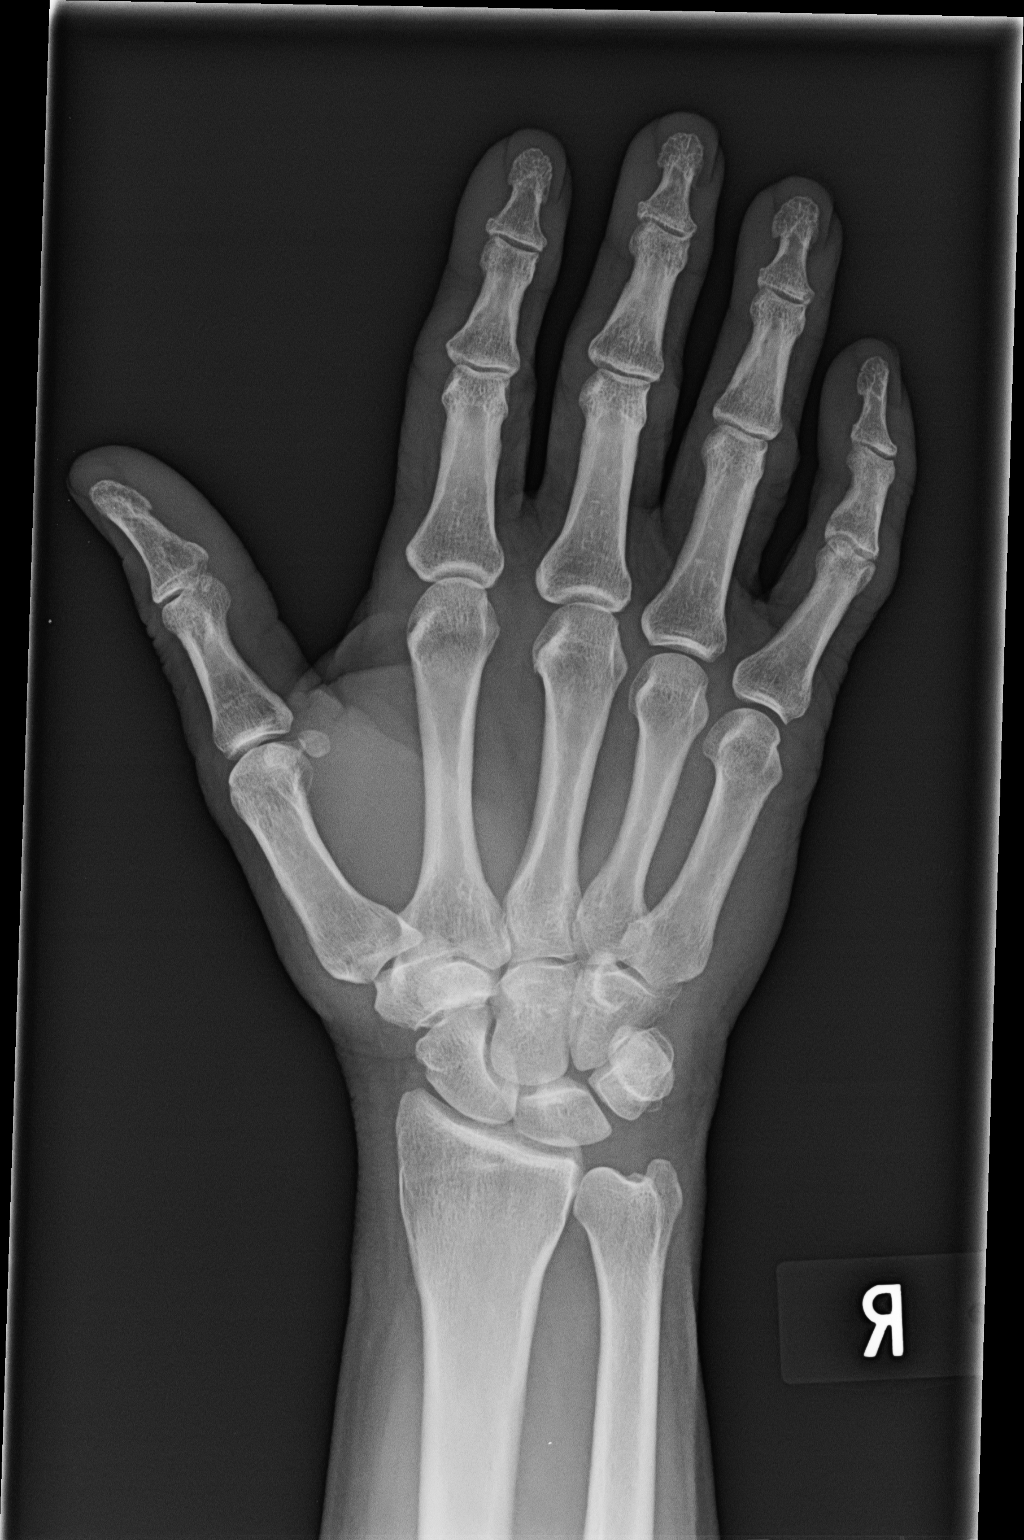

[view not recorded (2 of 3)]
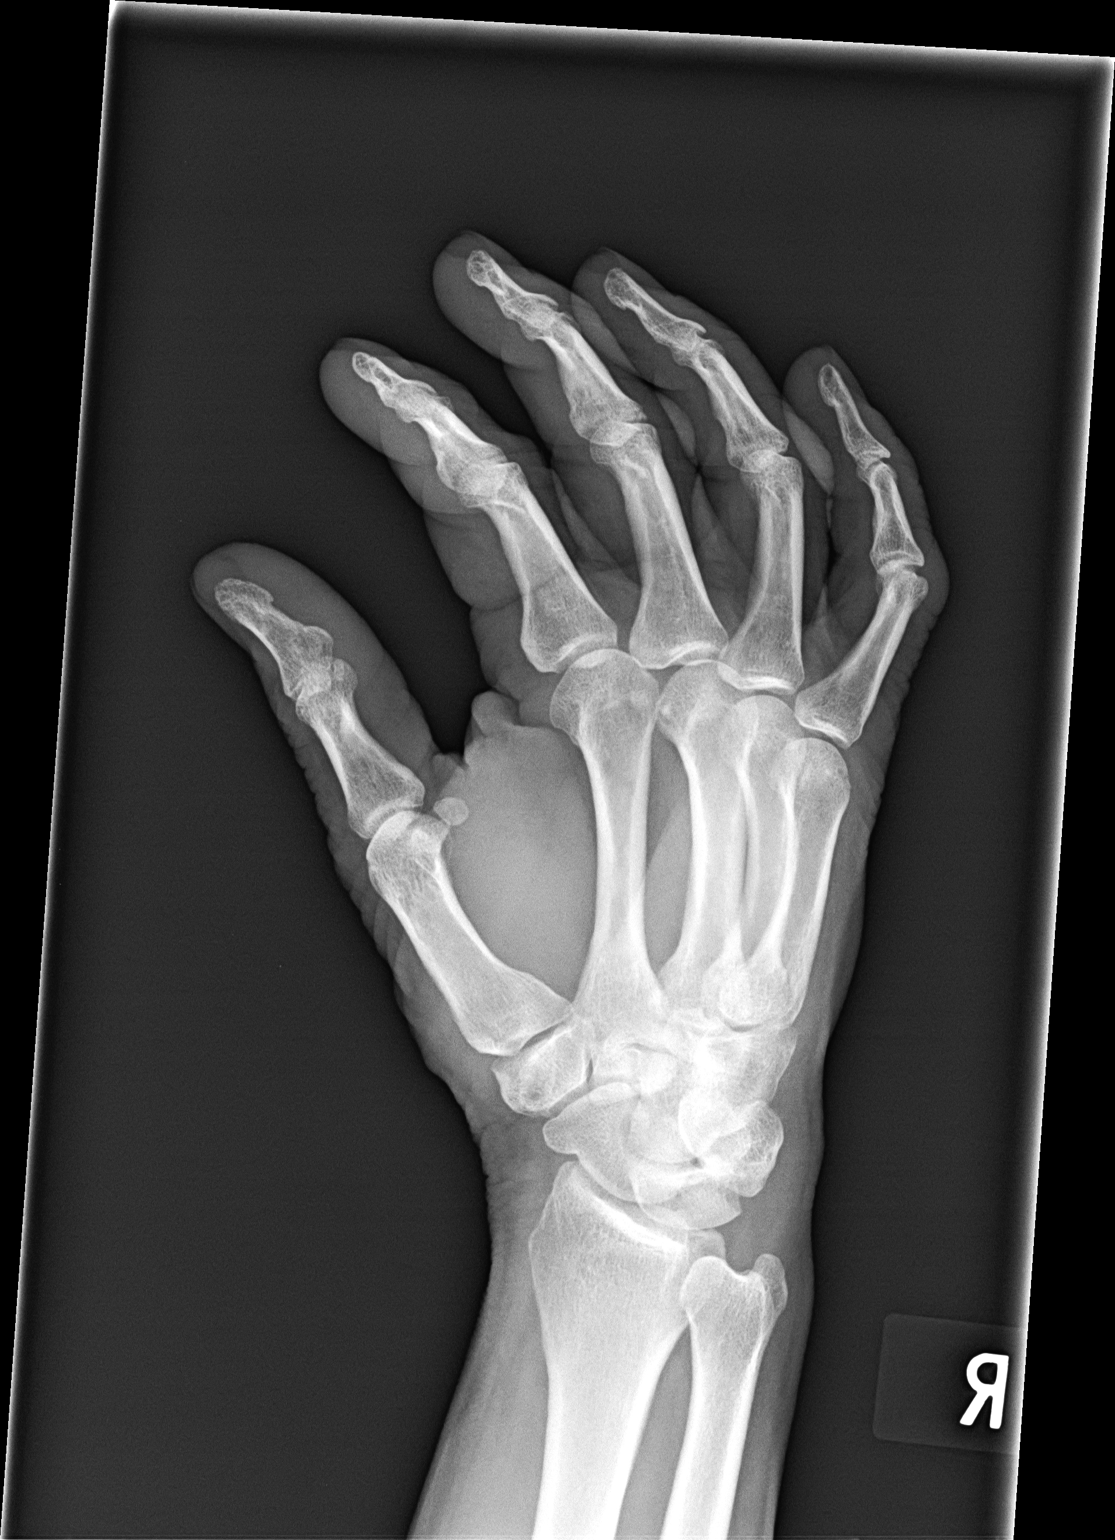

[view not recorded (3 of 3)]
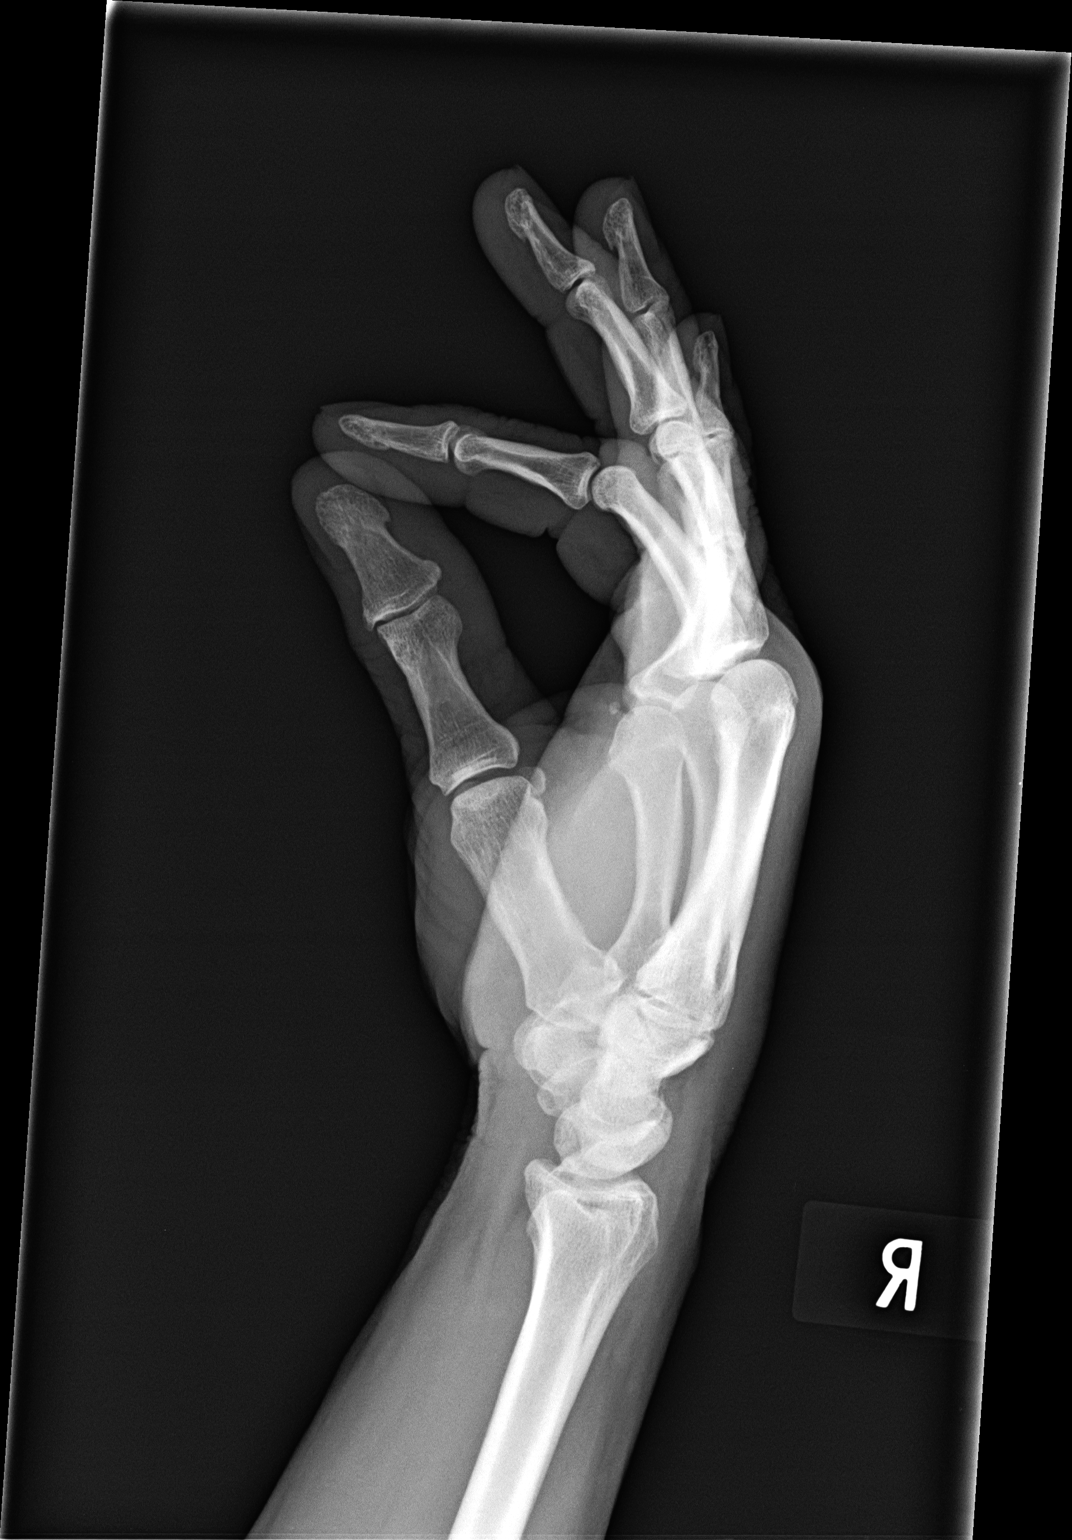

[3 of 3 positions shown; findings below may reference images not displayed]

FINDINGS: The radiocarpal joint space appears normal. The ulnar styloid is
intact. The carpal bones are in normal position. MCP, PIP, and DIP
joints are unremarkable with only minimal degenerative change of the
DIP joints. No erosion is seen.
IMPRESSION: No acute abnormality. Minimal degenerative change of the DIP joints.

## 2017-04-01 DIAGNOSIS — R5381 Other malaise: Secondary | ICD-10-CM | POA: Diagnosis not present

## 2017-04-22 ENCOUNTER — Ambulatory Visit (INDEPENDENT_AMBULATORY_CARE_PROVIDER_SITE_OTHER): Payer: 59 | Admitting: Family Medicine

## 2017-04-22 ENCOUNTER — Encounter: Payer: Self-pay | Admitting: Family Medicine

## 2017-04-22 VITALS — BP 131/82 | HR 68 | Temp 98.0°F | Ht 67.0 in | Wt 167.5 lb

## 2017-04-22 DIAGNOSIS — Z23 Encounter for immunization: Secondary | ICD-10-CM

## 2017-04-22 DIAGNOSIS — Z0000 Encounter for general adult medical examination without abnormal findings: Secondary | ICD-10-CM

## 2017-04-22 DIAGNOSIS — I10 Essential (primary) hypertension: Secondary | ICD-10-CM

## 2017-04-22 MED ORDER — HYDROCHLOROTHIAZIDE 25 MG PO TABS: 25.0000 mg | ORAL_TABLET | Freq: Every day | ORAL | 4 refills | Status: DC

## 2017-04-22 NOTE — Progress Notes (Signed)
BP 131/82   Pulse 68   Temp 98 F (36.7 C) (Oral)   Ht  (1.702 m)   Wt 167 lb 8 oz (76 kg)   BMI 26.23 kg/m    Subjective:    Patient ID: Dylan Ortiz, male    DOB: [birth year], 51 y.o.   MRN: [id number]  HPI: Dylan Ortiz is a 51 y.o. male presenting on 04/22/2017 for Medication Refill   HPI Well adult exam and hypertension recheck Patient is coming in for well adult exam hypertension recheck. His blood pressure today is 131/82 and he is currently on hydrochlorothiazide. He denies any side effects from the medication. He denies any major health issues and has been doing relatively well.Patient denies any chest pain, shortness of breath, headaches or vision issues, abdominal complaints, diarrhea, nausea, vomiting, or joint issues. He had labs done through his workplace which showed that renal function and liver kidneys were all within normal limits  Relevant past medical, surgical, family and social history reviewed and updated as indicated. Interim medical history since our last visit reviewed. Allergies and medications reviewed and updated.  Review of Systems  Constitutional: Negative for chills and fever.  HENT: Negative for ear pain and tinnitus.   Eyes: Negative for pain.  Respiratory: Negative for cough, shortness of breath and wheezing.   Cardiovascular: Negative for chest pain, palpitations and leg swelling.  Gastrointestinal: Negative for abdominal pain, blood in stool, constipation and diarrhea.  Genitourinary: Negative for dysuria and hematuria.  Musculoskeletal: Negative for back pain and myalgias.  Skin: Negative for rash.  Neurological: Negative for dizziness, weakness and headaches.  Psychiatric/Behavioral: Negative for suicidal ideas.    Per HPI unless specifically indicated above        Objective:    BP 131/82   Pulse 68   Temp 98 F (36.7 C) (Oral)   Ht  (1.702 m)   Wt 167 lb 8 oz (76 kg)   BMI 26.23 kg/m   Wt Readings from Last 3  Encounters:  04/22/17 167 lb 8 oz (76 kg)  04/01/16 170 lb 6 oz (77.3 kg)  10/24/15 177 lb (80.3 kg)    Physical Exam  Constitutional: He is oriented to person, place, and time. He appears well-developed and well-nourished. No distress.  HENT:  Right Ear: External ear normal.  Left Ear: External ear normal.  Nose: Nose normal.  Mouth/Throat: Oropharynx is clear and moist. No oropharyngeal exudate.  Eyes: Pupils are equal, round, and reactive to light. Conjunctivae and EOM are normal. Right eye exhibits no discharge. No scleral icterus.  Neck: Neck supple. No thyromegaly present.  Cardiovascular: Normal rate, regular rhythm, normal heart sounds and intact distal pulses.   No murmur heard. Pulmonary/Chest: Effort normal and breath sounds normal. No respiratory distress. He has no wheezes.  Abdominal: Soft. Bowel sounds are normal. He exhibits no distension. There is no tenderness. There is no rebound and no guarding.  Musculoskeletal: Normal range of motion. He exhibits no edema.  Lymphadenopathy:    He has no cervical adenopathy.  Neurological: He is alert and oriented to person, place, and time. Coordination normal.  Skin: Skin is warm and dry. No rash noted. He is not diaphoretic.  Psychiatric: He has a normal mood and affect. His behavior is normal.  Vitals reviewed.       Assessment & Plan:   Problem List Items Addressed This Visit      Cardiovascular and Mediastinum   Essential hypertension, benign  Relevant Medications   hydrochlorothiazide (HYDRODIURIL) 25 MG tablet    Other Visit Diagnoses    Well adult exam    -  Primary       Follow up plan: Return in about 1 year (around 04/22/2018), or if symptoms worsen or fail to improve.  Counseling provided for all of the vaccine components No orders of the defined types were placed in this encounter.   Arville Care, MD [facility] 04/22/2017, 4:47 PM

## 2017-04-22 NOTE — Addendum Note (Signed)
Addended by: Quay Burow on: 04/22/2017 04:57 PM   Modules accepted: Orders

## 2017-07-15 ENCOUNTER — Encounter: Payer: 59 | Admitting: Family Medicine

## 2017-08-05 ENCOUNTER — Encounter: Payer: Self-pay | Admitting: Family Medicine

## 2017-08-05 ENCOUNTER — Ambulatory Visit (INDEPENDENT_AMBULATORY_CARE_PROVIDER_SITE_OTHER): Payer: 59 | Admitting: Family Medicine

## 2017-08-05 VITALS — BP 128/83 | HR 73 | Temp 97.4°F | Ht 67.0 in | Wt 171.0 lb

## 2017-08-05 DIAGNOSIS — Z0000 Encounter for general adult medical examination without abnormal findings: Secondary | ICD-10-CM

## 2017-08-05 DIAGNOSIS — I10 Essential (primary) hypertension: Secondary | ICD-10-CM

## 2017-08-05 DIAGNOSIS — Z13220 Encounter for screening for lipoid disorders: Secondary | ICD-10-CM

## 2017-08-05 NOTE — Progress Notes (Signed)
BP 128/83   Pulse 73   Temp (!) 97.4 F (36.3 C) (Oral)   Ht '5\' 7"'  (1.702 m)   Wt 171 lb (77.6 kg)   BMI 26.78 kg/m    Subjective:    Patient ID: Dylan Ortiz, male    DOB: 12/23/1965, 52 y.o.   MRN: [id number]  HPI: Dylan Ortiz is a 52 y.o. male presenting on 08/05/2017 for Hypertension   HPI Adult well exam Patient is coming in for a well adult exam and physical and fasting labs.  He denies any major health issues.  Is also coming in for hypertension recheck. Patient denies any chest pain, shortness of breath, headaches or vision issues, abdominal complaints, diarrhea, nausea, vomiting, or joint issues.   Hypertension Patient is currently on hydrochlorothiazide, and their blood pressure today is 128/83. Patient denies any lightheadedness or dizziness. Patient denies headaches, blurred vision, chest pains, shortness of breath, or weakness. Denies any side effects from medication and is content with current medication.   Relevant past medical, surgical, family and social history reviewed and updated as indicated. Interim medical history since our last visit reviewed. Allergies and medications reviewed and updated.  Review of Systems  Constitutional: Negative for chills and fever.  HENT: Negative for ear pain and tinnitus.   Eyes: Negative for pain and discharge.  Respiratory: Negative for cough, shortness of breath and wheezing.   Cardiovascular: Negative for chest pain, palpitations and leg swelling.  Gastrointestinal: Negative for abdominal pain, blood in stool, constipation and diarrhea.  Genitourinary: Negative for dysuria and hematuria.  Musculoskeletal: Negative for back pain, gait problem and myalgias.  Skin: Negative for rash.  Neurological: Negative for dizziness, weakness, numbness and headaches.  Psychiatric/Behavioral: Negative for suicidal ideas.  All other systems reviewed and are negative.   Per HPI unless specifically indicated above   Allergies as of  08/05/2017   No Known Allergies     Medication List        Accurate as of 08/05/17  3:26 PM. Always use your most recent med list.          hydrochlorothiazide 25 MG tablet Commonly known as:  HYDRODIURIL Take 1 tablet (25 mg total) by mouth daily.   nystatin ointment Commonly known as:  MYCOSTATIN APPLY TO AFFECTED AREA TWICE A DAY   triamcinolone ointment 0.1 % Commonly known as:  KENALOG APPLY TO AFFECTED AREA TWICE A DAY          Objective:    BP 128/83   Pulse 73   Temp (!) 97.4 F (36.3 C) (Oral)   Ht '5\' 7"'  (1.702 m)   Wt 171 lb (77.6 kg)   BMI 26.78 kg/m   Wt Readings from Last 3 Encounters:  08/05/17 171 lb (77.6 kg)  04/22/17 167 lb 8 oz (76 kg)  04/01/16 170 lb 6 oz (77.3 kg)    Physical Exam  Constitutional: He is oriented to person, place, and time. He appears well-developed and well-nourished. No distress.  HENT:  Right Ear: External ear normal.  Left Ear: External ear normal.  Nose: Nose normal.  Mouth/Throat: Oropharynx is clear and moist. No oropharyngeal exudate.  Eyes: Conjunctivae and EOM are normal. Pupils are equal, round, and reactive to light. Right eye exhibits no discharge. No scleral icterus.  Neck: Neck supple. No thyromegaly present.  Cardiovascular: Normal rate, regular rhythm, normal heart sounds and intact distal pulses.  No murmur heard. Pulmonary/Chest: Effort normal and breath sounds normal. No respiratory distress.  He has no wheezes.  Abdominal: Soft. Bowel sounds are normal. He exhibits no distension. There is no tenderness. There is no rebound and no guarding.  Musculoskeletal: Normal range of motion. He exhibits no edema.  Lymphadenopathy:    He has no cervical adenopathy.  Neurological: He is alert and oriented to person, place, and time. Coordination normal.  Skin: Skin is warm and dry. No rash noted. He is not diaphoretic.  Psychiatric: He has a normal mood and affect. His behavior is normal.  Vitals  reviewed.       Assessment & Plan:   Problem List Items Addressed This Visit      Cardiovascular and Mediastinum   Essential hypertension, benign   Relevant Orders   CMP14+EGFR (Completed)    Other Visit Diagnoses    Well adult exam    -  Primary   Relevant Orders   CBC with Differential/Platelet (Completed)   CMP14+EGFR (Completed)   Lipid panel (Completed)   Lipid screening       Relevant Orders   Lipid panel (Completed)       Follow up plan: Return in about 1 year (around 08/05/2018), or if symptoms worsen or fail to improve.  Counseling provided for all of the vaccine components Orders Placed This Encounter  Procedures  . CBC with Differential/Platelet  . CMP14+EGFR  . Lipid panel    Caryl Pina, MD [facility] 08/05/2017, 3:26 PM

## 2017-08-06 LAB — CMP14+EGFR
A/G RATIO: 1.8 (ref 1.2–2.2)
ALBUMIN: 4.6 g/dL (ref 3.5–5.5)
ALT: 30 IU/L (ref 0–44)
AST: 27 IU/L (ref 0–40)
Alkaline Phosphatase: 58 IU/L (ref 39–117)
BILIRUBIN TOTAL: 0.5 mg/dL (ref 0.0–1.2)
BUN / CREAT RATIO: 16 (ref 9–20)
BUN: 16 mg/dL (ref 6–24)
CHLORIDE: 101 mmol/L (ref 96–106)
CO2: 24 mmol/L (ref 20–29)
Calcium: 9.7 mg/dL (ref 8.7–10.2)
Creatinine, Ser: 1 mg/dL (ref 0.76–1.27)
GFR calc Af Amer: 99 mL/min/{1.73_m2} (ref >59)
GFR calc non Af Amer: 86 mL/min/{1.73_m2} (ref >59)
GLOBULIN, TOTAL: 2.5 g/dL (ref 1.5–4.5)
Glucose: 84 mg/dL (ref 65–99)
POTASSIUM: 3.8 mmol/L (ref 3.5–5.2)
SODIUM: 139 mmol/L (ref 134–144)
Total Protein: 7.1 g/dL (ref 6.0–8.5)

## 2017-08-06 LAB — LIPID PANEL
Chol/HDL Ratio: 2.1 ratio (ref 0.0–5.0)
Cholesterol, Total: 121 mg/dL (ref 100–199)
HDL: 59 mg/dL (ref >39)
LDL Calculated: 56 mg/dL (ref 0–99)
Triglycerides: 32 mg/dL (ref 0–149)
VLDL CHOLESTEROL CAL: 6 mg/dL (ref 5–40)

## 2017-08-06 LAB — CBC WITH DIFFERENTIAL/PLATELET
BASOS: 0 %
Basophils Absolute: 0 10*3/uL (ref 0.0–0.2)
EOS (ABSOLUTE): 0.3 10*3/uL (ref 0.0–0.4)
Eos: 4 %
HEMATOCRIT: 44.1 % (ref 37.5–51.0)
HEMOGLOBIN: 14.9 g/dL (ref 13.0–17.7)
IMMATURE GRANS (ABS): 0 10*3/uL (ref 0.0–0.1)
Immature Granulocytes: 0 %
LYMPHS ABS: 2 10*3/uL (ref 0.7–3.1)
LYMPHS: 27 %
MCH: 29.5 pg (ref 26.6–33.0)
MCHC: 33.8 g/dL (ref 31.5–35.7)
MCV: 87 fL (ref 79–97)
MONOCYTES: 8 %
Monocytes Absolute: 0.6 10*3/uL (ref 0.1–0.9)
NEUTROS ABS: 4.4 10*3/uL (ref 1.4–7.0)
Neutrophils: 61 %
Platelets: 197 10*3/uL (ref 150–379)
RBC: 5.05 x10E6/uL (ref 4.14–5.80)
RDW: 15 % (ref 12.3–15.4)
WBC: 7.3 10*3/uL (ref 3.4–10.8)

## 2018-05-08 ENCOUNTER — Other Ambulatory Visit: Payer: Self-pay | Admitting: Family Medicine

## 2018-05-08 DIAGNOSIS — I10 Essential (primary) hypertension: Secondary | ICD-10-CM

## 2018-05-08 MED ORDER — HYDROCHLOROTHIAZIDE 25 MG PO TABS: 25.0000 mg | ORAL_TABLET | Freq: Every day | ORAL | 0 refills | Status: DC

## 2018-05-08 NOTE — Telephone Encounter (Signed)
Per 08/05/17 OV rtc 1 yr. Pt aware refill sent to pharmacy

## 2018-08-07 ENCOUNTER — Encounter: Payer: Self-pay | Admitting: Family Medicine

## 2018-08-07 ENCOUNTER — Ambulatory Visit (INDEPENDENT_AMBULATORY_CARE_PROVIDER_SITE_OTHER): Payer: 59 | Admitting: Family Medicine

## 2018-08-07 VITALS — BP 138/87 | HR 73 | Temp 97.0°F | Ht 67.0 in | Wt 178.2 lb

## 2018-08-07 DIAGNOSIS — Z0000 Encounter for general adult medical examination without abnormal findings: Secondary | ICD-10-CM

## 2018-08-07 DIAGNOSIS — I10 Essential (primary) hypertension: Secondary | ICD-10-CM

## 2018-08-07 DIAGNOSIS — Z0001 Encounter for general adult medical examination with abnormal findings: Secondary | ICD-10-CM | POA: Diagnosis not present

## 2018-08-07 DIAGNOSIS — Z13220 Encounter for screening for lipoid disorders: Secondary | ICD-10-CM | POA: Diagnosis not present

## 2018-08-07 MED ORDER — HYDROCHLOROTHIAZIDE 25 MG PO TABS: 25.0000 mg | ORAL_TABLET | Freq: Every day | ORAL | 3 refills | Status: DC

## 2018-08-07 NOTE — Progress Notes (Signed)
BP 138/87   Pulse 73   Temp (!) 97 F (36.1 C) (Oral)   Ht 5\' 7"  (1.702 m)   Wt 178 lb 3.2 oz (80.8 kg)   BMI 27.91 kg/m    Subjective:    Patient ID: Dylan Ortiz, male    DOB: [birth year], 53 y.o.   MRN: [id number]  HPI: Dylan Ortiz is a 53 y.o. male presenting on 08/07/2018 for Annual Exam   HPI Adult well exam and hypertension recheck Patient is coming in today for well adult exam and physical and hypertension recheck.  He is currently taking hydrochlorothiazide and has been keeping an eye on his blood pressures and it looks like at home they have been running anywhere from 108-140 over 60s to 80s.  He denies any other major health issues or concerns except for the occasional arthritis. Patient denies any chest pain, shortness of breath, headaches or vision issues, abdominal complaints, diarrhea, nausea, vomiting, or joint issues.   Relevant past medical, surgical, family and social history reviewed and updated as indicated. Interim medical history since our last visit reviewed. Allergies and medications reviewed and updated.  Review of Systems  Constitutional: Negative for chills and fever.  HENT: Negative for tinnitus.   Eyes: Negative for pain.  Respiratory: Negative for cough, shortness of breath and wheezing.   Cardiovascular: Negative for chest pain, palpitations and leg swelling.  Gastrointestinal: Negative for abdominal pain, blood in stool, constipation and diarrhea.  Genitourinary: Negative for dysuria and hematuria.  Musculoskeletal: Negative for back pain and myalgias.  Skin: Negative for rash.  Neurological: Negative for dizziness, weakness and headaches.  Psychiatric/Behavioral: Negative for suicidal ideas.   Per HPI unless specifically indicated above   Allergies as of 08/07/2018   No Known Allergies     Medication List       Accurate as of August 07, 2018  3:07 PM. Always use your most recent med list.        hydrochlorothiazide 25 MG  tablet Commonly known as:  HYDRODIURIL Take 1 tablet (25 mg total) by mouth daily.          Objective:    BP 138/87   Pulse 73   Temp (!) 97 F (36.1 C) (Oral)   Ht 5\' 7"  (1.702 m)   Wt 178 lb 3.2 oz (80.8 kg)   BMI 27.91 kg/m   Wt Readings from Last 3 Encounters:  08/07/18 178 lb 3.2 oz (80.8 kg)  08/05/17 171 lb (77.6 kg)  04/22/17 167 lb 8 oz (76 kg)    Physical Exam Vitals signs and nursing note reviewed.  Constitutional:      General: He is not in acute distress.    Appearance: He is well-developed. He is not diaphoretic.  HENT:     Right Ear: External ear normal.     Left Ear: External ear normal.     Nose: Nose normal.     Mouth/Throat:     Pharynx: No oropharyngeal exudate.  Eyes:     General: No scleral icterus.    Conjunctiva/sclera: Conjunctivae normal.  Neck:     Musculoskeletal: Neck supple.     Thyroid: No thyromegaly.  Cardiovascular:     Rate and Rhythm: Normal rate and regular rhythm.     Heart sounds: Normal heart sounds. No murmur.  Pulmonary:     Effort: Pulmonary effort is normal. No respiratory distress.     Breath sounds: Normal breath sounds. No wheezing.  Abdominal:  General: Bowel sounds are normal. There is no distension.     Palpations: Abdomen is soft.     Tenderness: There is no abdominal tenderness. There is no guarding or rebound.  Musculoskeletal: Normal range of motion.  Lymphadenopathy:     Cervical: No cervical adenopathy.  Skin:    General: Skin is warm and dry.     Findings: No rash.  Neurological:     Mental Status: He is alert and oriented to person, place, and time.     Coordination: Coordination normal.  Psychiatric:        Behavior: Behavior normal.     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    Assessment & Plan:   Problem List Items Addressed This Visit      Cardiovascular and Mediastinum   Essential hypertension,  benign   Relevant Medications   hydrochlorothiazide (HYDRODIURIL) 25 MG tablet    Other Visit Diagnoses    Well adult exam    -  Primary   Lipid screening           Follow up plan: Return in about 1 year (around 08/08/2019), or if symptoms worsen or fail to improve, for Well physical and hypertension.  Counseling provided for all of the vaccine components No orders of the defined types were placed in this encounter.   Arville Care, MD [facility] 08/07/2018, 3:07 PM

## 2019-05-21 ENCOUNTER — Other Ambulatory Visit: Payer: Self-pay | Admitting: Family Medicine

## 2019-05-21 DIAGNOSIS — I10 Essential (primary) hypertension: Secondary | ICD-10-CM

## 2019-08-10 ENCOUNTER — Encounter: Payer: 59 | Admitting: Family Medicine

## 2019-09-13 ENCOUNTER — Other Ambulatory Visit: Payer: Self-pay

## 2019-09-14 ENCOUNTER — Encounter: Payer: Self-pay | Admitting: Family Medicine

## 2019-09-14 ENCOUNTER — Other Ambulatory Visit: Payer: Self-pay

## 2019-09-14 ENCOUNTER — Ambulatory Visit (INDEPENDENT_AMBULATORY_CARE_PROVIDER_SITE_OTHER): Payer: 59 | Admitting: Family Medicine

## 2019-09-14 VITALS — BP 126/83 | HR 66 | Temp 97.5°F | Ht 67.0 in | Wt 160.8 lb

## 2019-09-14 DIAGNOSIS — Z13220 Encounter for screening for lipoid disorders: Secondary | ICD-10-CM | POA: Diagnosis not present

## 2019-09-14 DIAGNOSIS — Z1159 Encounter for screening for other viral diseases: Secondary | ICD-10-CM

## 2019-09-14 DIAGNOSIS — I10 Essential (primary) hypertension: Secondary | ICD-10-CM | POA: Diagnosis not present

## 2019-09-14 DIAGNOSIS — Z0001 Encounter for general adult medical examination with abnormal findings: Secondary | ICD-10-CM

## 2019-09-14 DIAGNOSIS — Z125 Encounter for screening for malignant neoplasm of prostate: Secondary | ICD-10-CM

## 2019-09-14 MED ORDER — HYDROCHLOROTHIAZIDE 25 MG PO TABS: 25.0000 mg | ORAL_TABLET | Freq: Every day | ORAL | 3 refills | Status: DC

## 2019-09-14 NOTE — Progress Notes (Signed)
BP 126/83   Pulse 66   Temp (!) 97.5 F (36.4 C) (Temporal)   Ht '5\' 7"'  (1.702 m)   Wt 160 lb 12.8 oz (72.9 kg)   SpO2 98%   BMI 25.18 kg/m    Subjective:   Patient ID: Dylan Ortiz, male    DOB: [birth year], 54 y.o.   MRN: [id number]  HPI: Dylan Ortiz is a 54 y.o. male presenting on 09/14/2019 for Annual Exam   HPI Adult well exam Patient denies any chest pain, shortness of breath, headaches or vision issues, abdominal complaints, diarrhea, nausea, vomiting, or joint issues.   Hypertension Patient is currently on hydrochlorothiazide, and their blood pressure today is 126/83. Patient denies any lightheadedness or dizziness. Patient denies headaches, blurred vision, chest pains, shortness of breath, or weakness. Denies any side effects from medication and is content with current medication.   Relevant past medical, surgical, family and social history reviewed and updated as indicated. Interim medical history since our last visit reviewed. Allergies and medications reviewed and updated.  Review of Systems  Constitutional: Negative for chills and fever.  HENT: Negative for ear pain and tinnitus.   Eyes: Negative for pain and discharge.  Respiratory: Negative for cough, shortness of breath and wheezing.   Cardiovascular: Negative for chest pain, palpitations and leg swelling.  Gastrointestinal: Negative for abdominal pain, blood in stool, constipation and diarrhea.  Genitourinary: Negative for dysuria and hematuria.  Musculoskeletal: Negative for back pain, gait problem and myalgias.  Skin: Negative for rash.  Neurological: Negative for dizziness, weakness and headaches.  Psychiatric/Behavioral: Negative for suicidal ideas.  All other systems reviewed and are negative.   Per HPI unless specifically indicated above   Allergies as of 09/14/2019   No Known Allergies     Medication List       Accurate as of September 14, 2019  3:08 PM. If you have any questions, ask your  nurse or doctor.        hydrochlorothiazide 25 MG tablet Commonly known as: HYDRODIURIL Take 1 tablet (25 mg total) by mouth daily. What changed: additional instructions Changed by: Fransisca Kaufmann Tilak Oakley, MD        Objective:   BP 126/83   Pulse 66   Temp (!) 97.5 F (36.4 C) (Temporal)   Ht '5\' 7"'  (1.702 m)   Wt 160 lb 12.8 oz (72.9 kg)   SpO2 98%   BMI 25.18 kg/m   Wt Readings from Last 3 Encounters:  09/14/19 160 lb 12.8 oz (72.9 kg)  08/07/18 178 lb 3.2 oz (80.8 kg)  08/05/17 171 lb (77.6 kg)    Physical Exam Vitals and nursing note reviewed.  Constitutional:      General: He is not in acute distress.    Appearance: He is well-developed. He is not diaphoretic.  HENT:     Right Ear: External ear normal.     Left Ear: External ear normal.     Nose: Nose normal.     Mouth/Throat:     Pharynx: No oropharyngeal exudate.  Eyes:     General: No scleral icterus.       Right eye: No discharge.     Conjunctiva/sclera: Conjunctivae normal.     Pupils: Pupils are equal, round, and reactive to light.  Neck:     Thyroid: No thyromegaly.  Cardiovascular:     Rate and Rhythm: Normal rate and regular rhythm.     Heart sounds: Normal heart sounds. No murmur.  Pulmonary:  Effort: Pulmonary effort is normal. No respiratory distress.     Breath sounds: Normal breath sounds. No wheezing.  Abdominal:     General: Bowel sounds are normal. There is no distension.     Palpations: Abdomen is soft.     Tenderness: There is no abdominal tenderness. There is no guarding or rebound.  Musculoskeletal:        General: Normal range of motion.     Cervical back: Neck supple.  Lymphadenopathy:     Cervical: No cervical adenopathy.  Skin:    General: Skin is warm and dry.     Findings: No rash.  Neurological:     Mental Status: He is alert and oriented to person, place, and time.     Coordination: Coordination normal.  Psychiatric:        Behavior: Behavior normal.        Assessment & Plan:   Problem List Items Addressed This Visit      Cardiovascular and Mediastinum   Essential hypertension, benign - Primary   Relevant Medications   hydrochlorothiazide (HYDRODIURIL) 25 MG tablet   Other Relevant Orders   CBC with Differential/Platelet   CMP14+EGFR    Other Visit Diagnoses    Lipid screening       Relevant Orders   Lipid panel   Prostate cancer screening       Relevant Orders   PSA, total and free      Continue hydrochlorothiazide, blood pressure looks great, will do blood work and go from there. Follow up plan: Return in about 1 year (around 09/13/2020), or if symptoms worsen or fail to improve, for adult well exam.  Counseling provided for all of the vaccine components Orders Placed This Encounter  Procedures  . CBC with Differential/Platelet  . CMP14+EGFR  . Lipid panel  . PSA, total and free    Caryl Pina, MD [facility] 09/14/2019, 3:08 PM

## 2019-09-15 LAB — PSA, TOTAL AND FREE
PSA, Free Pct: 66.7 %
PSA, Free: 0.2 ng/mL
Prostate Specific Ag, Serum: 0.3 ng/mL (ref 0.0–4.0)

## 2019-09-15 LAB — CBC WITH DIFFERENTIAL/PLATELET
Basophils Absolute: 0 10*3/uL (ref 0.0–0.2)
Basos: 1 %
EOS (ABSOLUTE): 0.2 10*3/uL (ref 0.0–0.4)
Eos: 4 %
Hematocrit: 43.4 % (ref 37.5–51.0)
Hemoglobin: 15.1 g/dL (ref 13.0–17.7)
Immature Grans (Abs): 0 10*3/uL (ref 0.0–0.1)
Immature Granulocytes: 0 %
Lymphocytes Absolute: 2.2 10*3/uL (ref 0.7–3.1)
Lymphs: 39 %
MCH: 30.4 pg (ref 26.6–33.0)
MCHC: 34.8 g/dL (ref 31.5–35.7)
MCV: 88 fL (ref 79–97)
Monocytes Absolute: 0.4 10*3/uL (ref 0.1–0.9)
Monocytes: 7 %
Neutrophils Absolute: 2.8 10*3/uL (ref 1.4–7.0)
Neutrophils: 49 %
Platelets: 202 10*3/uL (ref 150–450)
RBC: 4.96 x10E6/uL (ref 4.14–5.80)
RDW: 14.2 % (ref 11.6–15.4)
WBC: 5.6 10*3/uL (ref 3.4–10.8)

## 2019-09-15 LAB — CMP14+EGFR
ALT: 25 IU/L (ref 0–44)
AST: 27 IU/L (ref 0–40)
Albumin/Globulin Ratio: 1.7 (ref 1.2–2.2)
Albumin: 4.4 g/dL (ref 3.8–4.9)
Alkaline Phosphatase: 70 IU/L (ref 39–117)
BUN/Creatinine Ratio: 16 (ref 9–20)
BUN: 18 mg/dL (ref 6–24)
Bilirubin Total: 0.6 mg/dL (ref 0.0–1.2)
CO2: 25 mmol/L (ref 20–29)
Calcium: 9.9 mg/dL (ref 8.7–10.2)
Chloride: 101 mmol/L (ref 96–106)
Creatinine, Ser: 1.1 mg/dL (ref 0.76–1.27)
GFR calc Af Amer: 87 mL/min/{1.73_m2} (ref >59)
GFR calc non Af Amer: 75 mL/min/{1.73_m2} (ref >59)
Globulin, Total: 2.6 g/dL (ref 1.5–4.5)
Glucose: 82 mg/dL (ref 65–99)
Potassium: 4.1 mmol/L (ref 3.5–5.2)
Sodium: 140 mmol/L (ref 134–144)
Total Protein: 7 g/dL (ref 6.0–8.5)

## 2019-09-15 LAB — LIPID PANEL
Chol/HDL Ratio: 1.6 ratio (ref 0.0–5.0)
Cholesterol, Total: 145 mg/dL (ref 100–199)
HDL: 90 mg/dL (ref >39)
LDL Chol Calc (NIH): 48 mg/dL (ref 0–99)
Triglycerides: 23 mg/dL (ref 0–149)
VLDL Cholesterol Cal: 7 mg/dL (ref 5–40)

## 2019-09-15 LAB — HEPATITIS C ANTIBODY: Hep C Virus Ab: <0.1 s/co ratio (ref 0.0–0.9)

## 2020-09-26 ENCOUNTER — Ambulatory Visit (INDEPENDENT_AMBULATORY_CARE_PROVIDER_SITE_OTHER): Payer: 59 | Admitting: Family Medicine

## 2020-09-26 ENCOUNTER — Other Ambulatory Visit: Payer: Self-pay

## 2020-09-26 ENCOUNTER — Encounter: Payer: Self-pay | Admitting: Family Medicine

## 2020-09-26 VITALS — BP 142/79 | HR 67 | Ht 67.0 in | Wt 159.0 lb

## 2020-09-26 DIAGNOSIS — Z13220 Encounter for screening for lipoid disorders: Secondary | ICD-10-CM

## 2020-09-26 DIAGNOSIS — Z0000 Encounter for general adult medical examination without abnormal findings: Secondary | ICD-10-CM

## 2020-09-26 DIAGNOSIS — Z0001 Encounter for general adult medical examination with abnormal findings: Secondary | ICD-10-CM | POA: Diagnosis not present

## 2020-09-26 DIAGNOSIS — Z125 Encounter for screening for malignant neoplasm of prostate: Secondary | ICD-10-CM | POA: Diagnosis not present

## 2020-09-26 DIAGNOSIS — I10 Essential (primary) hypertension: Secondary | ICD-10-CM

## 2020-09-26 DIAGNOSIS — Z131 Encounter for screening for diabetes mellitus: Secondary | ICD-10-CM

## 2020-09-26 MED ORDER — HYDROCHLOROTHIAZIDE 25 MG PO TABS: 25.0000 mg | ORAL_TABLET | Freq: Every day | ORAL | 3 refills | Status: DC

## 2020-09-26 NOTE — Progress Notes (Signed)
BP (!) 142/79   Pulse 67   Ht _0  (1.702 m)   Wt 159 lb (72.1 kg)   SpO2 99%   BMI 24.90 kg/m    Subjective:   Patient ID: Dylan Ortiz, male    DOB: 1965-11-17, 55 y.o.   MRN: [id number]  HPI: Dylan Ortiz is a 55 y.o. male presenting on 09/26/2020 for Medical Management of Chronic Issues (CPE) and Hypertension   HPI Adult well exam and physical Patient is coming in today for adult well exam and physical and recheck of chronic medical issues.  Patient denies any chest pain, shortness of breath, headaches or vision issues, abdominal complaints, diarrhea, nausea, vomiting, or joint issues.   Hypertension Patient is currently on hydrochlorothiazide, and their blood pressure today is 142/79. Patient denies any lightheadedness or dizziness. Patient denies headaches, blurred vision, chest pains, shortness of breath, or weakness. Denies any side effects from medication and is content with current medication.   Relevant past medical, surgical, family and social history reviewed and updated as indicated. Interim medical history since our last visit reviewed. Allergies and medications reviewed and updated.  Review of Systems  Constitutional: Negative for chills and fever.  HENT: Negative for ear pain and tinnitus.   Eyes: Negative for pain.  Respiratory: Negative for cough, shortness of breath and wheezing.   Cardiovascular: Negative for chest pain, palpitations and leg swelling.  Gastrointestinal: Negative for abdominal pain, blood in stool, constipation and diarrhea.  Genitourinary: Negative for dysuria and hematuria.  Musculoskeletal: Negative for back pain and myalgias.  Skin: Negative for rash.  Neurological: Negative for dizziness, weakness and headaches.  Psychiatric/Behavioral: Negative for suicidal ideas.    Per HPI unless specifically indicated above   Allergies as of 09/26/2020   No Known Allergies     Medication List       Accurate as of September 26, 2020  2:59 PM.  If you have any questions, ask your nurse or doctor.        hydrochlorothiazide 25 MG tablet Commonly known as: HYDRODIURIL Take 1 tablet (25 mg total) by mouth daily.        Objective:   BP (!) 142/79   Pulse 67   Ht _1  (1.702 m)   Wt 159 lb (72.1 kg)   SpO2 99%   BMI 24.90 kg/m   Wt Readings from Last 3 Encounters:  09/26/20 159 lb (72.1 kg)  09/14/19 160 lb 12.8 oz (72.9 kg)  08/07/18 178 lb 3.2 oz (80.8 kg)    Physical Exam Vitals reviewed.  Constitutional:      General: He is not in acute distress.    Appearance: He is well-developed. He is not diaphoretic.  HENT:     Right Ear: External ear normal.     Left Ear: External ear normal.     Nose: Nose normal.     Mouth/Throat:     Pharynx: No oropharyngeal exudate.  Eyes:     General: No scleral icterus.       Right eye: No discharge.     Conjunctiva/sclera: Conjunctivae normal.     Pupils: Pupils are equal, round, and reactive to light.  Neck:     Thyroid: No thyromegaly.  Cardiovascular:     Rate and Rhythm: Normal rate and regular rhythm.     Heart sounds: Normal heart sounds. No murmur heard.   Pulmonary:     Effort: Pulmonary effort is normal. No respiratory distress.  Breath sounds: Normal breath sounds. No wheezing.  Abdominal:     General: Bowel sounds are normal. There is no distension.     Palpations: Abdomen is soft.     Tenderness: There is no abdominal tenderness. There is no guarding or rebound.  Genitourinary:    Prostate: Normal.     Rectum: Normal.  Musculoskeletal:        General: Normal range of motion.     Cervical back: Neck supple.  Lymphadenopathy:     Cervical: No cervical adenopathy.  Skin:    General: Skin is warm and dry.     Findings: No rash.  Neurological:     Mental Status: He is alert and oriented to person, place, and time.     Coordination: Coordination normal.  Psychiatric:        Behavior: Behavior normal.       Assessment & Plan:   Problem List  Items Addressed This Visit      Cardiovascular and Mediastinum   Essential hypertension, benign   Relevant Medications   hydrochlorothiazide (HYDRODIURIL) 25 MG tablet   Other Relevant Orders   CBC with Differential/Platelet   CMP14+EGFR    Other Visit Diagnoses    Well adult exam    -  Primary   Relevant Orders   CBC with Differential/Platelet   CMP14+EGFR   Lipid panel   PSA, total and free   Prostate cancer screening       Relevant Orders   CBC with Differential/Platelet   PSA, total and free   Diabetes mellitus screening       Relevant Orders   CBC with Differential/Platelet   CMP14+EGFR   Screening for cholesterol level       Relevant Orders   Lipid panel      Continue current medication, will show blood work shows, patient does say he started cutting his hydrochlorothiazide in half and only doing 12-1/2 and it seems to be doing well for him we will continue forward with that. Blood pressure at home was 128/74 this morning per him.  Follow up plan: Return in about 1 year (around 09/26/2021), or if symptoms worsen or fail to improve, for Physical and hypertension.  Counseling provided for all of the vaccine components Orders Placed This Encounter  Procedures  . CBC with Differential/Platelet  . CMP14+EGFR  . Lipid panel  . PSA, total and free    Caryl Pina, MD [facility] 09/26/2020, 2:59 PM

## 2020-09-27 LAB — CBC WITH DIFFERENTIAL/PLATELET
Basophils Absolute: 0 10*3/uL (ref 0.0–0.2)
Basos: 1 %
EOS (ABSOLUTE): 0.3 10*3/uL (ref 0.0–0.4)
Eos: 4 %
Hematocrit: 44.7 % (ref 37.5–51.0)
Hemoglobin: 15.5 g/dL (ref 13.0–17.7)
Immature Grans (Abs): 0 10*3/uL (ref 0.0–0.1)
Immature Granulocytes: 1 %
Lymphocytes Absolute: 2.6 10*3/uL (ref 0.7–3.1)
Lymphs: 41 %
MCH: 30.2 pg (ref 26.6–33.0)
MCHC: 34.7 g/dL (ref 31.5–35.7)
MCV: 87 fL (ref 79–97)
Monocytes Absolute: 0.5 10*3/uL (ref 0.1–0.9)
Monocytes: 8 %
Neutrophils Absolute: 2.9 10*3/uL (ref 1.4–7.0)
Neutrophils: 45 %
Platelets: 221 10*3/uL (ref 150–450)
RBC: 5.14 x10E6/uL (ref 4.14–5.80)
RDW: 14.1 % (ref 11.6–15.4)
WBC: 6.3 10*3/uL (ref 3.4–10.8)

## 2020-09-27 LAB — LIPID PANEL
Chol/HDL Ratio: 2.1 ratio (ref 0.0–5.0)
Cholesterol, Total: 193 mg/dL (ref 100–199)
HDL: 91 mg/dL (ref >39)
LDL Chol Calc (NIH): 97 mg/dL (ref 0–99)
Triglycerides: 23 mg/dL (ref 0–149)
VLDL Cholesterol Cal: 5 mg/dL (ref 5–40)

## 2020-09-27 LAB — CMP14+EGFR
ALT: 27 IU/L (ref 0–44)
AST: 22 IU/L (ref 0–40)
Albumin/Globulin Ratio: 2.2 (ref 1.2–2.2)
Albumin: 4.9 g/dL (ref 3.8–4.9)
Alkaline Phosphatase: 66 IU/L (ref 44–121)
BUN/Creatinine Ratio: 20 (ref 9–20)
BUN: 21 mg/dL (ref 6–24)
Bilirubin Total: 0.5 mg/dL (ref 0.0–1.2)
CO2: 24 mmol/L (ref 20–29)
Calcium: 10.5 mg/dL — ABNORMAL HIGH (ref 8.7–10.2)
Chloride: 101 mmol/L (ref 96–106)
Creatinine, Ser: 1.03 mg/dL (ref 0.76–1.27)
Globulin, Total: 2.2 g/dL (ref 1.5–4.5)
Glucose: 93 mg/dL (ref 65–99)
Potassium: 5.2 mmol/L (ref 3.5–5.2)
Sodium: 142 mmol/L (ref 134–144)
Total Protein: 7.1 g/dL (ref 6.0–8.5)
eGFR: 85 mL/min/{1.73_m2} (ref >59)

## 2020-09-27 LAB — PSA, TOTAL AND FREE
PSA, Free Pct: 62.5 %
PSA, Free: 0.25 ng/mL
Prostate Specific Ag, Serum: 0.4 ng/mL (ref 0.0–4.0)

## 2021-10-02 ENCOUNTER — Ambulatory Visit (INDEPENDENT_AMBULATORY_CARE_PROVIDER_SITE_OTHER): Payer: 59 | Admitting: Family Medicine

## 2021-10-02 ENCOUNTER — Encounter: Payer: Self-pay | Admitting: Family Medicine

## 2021-10-02 VITALS — BP 144/82 | HR 62 | Ht 67.0 in | Wt 164.0 lb

## 2021-10-02 DIAGNOSIS — Z0001 Encounter for general adult medical examination with abnormal findings: Secondary | ICD-10-CM

## 2021-10-02 DIAGNOSIS — I10 Essential (primary) hypertension: Secondary | ICD-10-CM

## 2021-10-02 DIAGNOSIS — Z0000 Encounter for general adult medical examination without abnormal findings: Secondary | ICD-10-CM

## 2021-10-02 MED ORDER — HYDROCHLOROTHIAZIDE 25 MG PO TABS: 25.0000 mg | ORAL_TABLET | Freq: Every day | ORAL | 3 refills | Status: DC

## 2021-10-02 NOTE — Progress Notes (Signed)
? ?BP (!) 144/82   Pulse 62   Ht 5\' 7"  (1.702 m)   Wt 164 lb (74.4 kg)   SpO2 100%   BMI 25.69 kg/m?   ? ?Subjective:  ? ?Patient ID: Dylan Ortiz, male    DOB: May 11, 1966, 56 y.o.   MRN: [id number] ? ?HPI: ?Dylan Ortiz is a 56 y.o. male presenting on 10/02/2021 for Medical Management of Chronic Issues (CPE) ? ? ?HPI ?Physical exam ?Patient denies any chest pain, shortness of breath, headaches or vision issues, abdominal complaints, diarrhea, nausea, vomiting, or joint issues.  ? ?Hypertension ?Patient is currently on hydrochlorothiazide, and their blood pressure today is 144/82. Patient denies any lightheadedness or dizziness. Patient denies headaches, blurred vision, chest pains, shortness of breath, or weakness. Denies any side effects from medication and is content with current medication.  ? ?Relevant past medical, surgical, family and social history reviewed and updated as indicated. Interim medical history since our last visit reviewed. ?Allergies and medications reviewed and updated. ? ?Review of Systems  ?Constitutional:  Negative for chills and fever.  ?HENT:  Negative for ear pain and tinnitus.   ?Eyes:  Negative for pain and discharge.  ?Respiratory:  Negative for cough, shortness of breath and wheezing.   ?Cardiovascular:  Negative for chest pain, palpitations and leg swelling.  ?Gastrointestinal:  Negative for abdominal pain, blood in stool, constipation and diarrhea.  ?Genitourinary:  Negative for dysuria and hematuria.  ?Musculoskeletal:  Negative for back pain, gait problem and myalgias.  ?Skin:  Negative for rash.  ?Neurological:  Negative for dizziness, weakness and headaches.  ?Psychiatric/Behavioral:  Negative for suicidal ideas.   ?All other systems reviewed and are negative. ? ?Per HPI unless specifically indicated above ? ? ?Allergies as of 10/02/2021   ?No Known Allergies ?  ? ?  ?Medication List  ?  ? ?  ? Accurate as of October 02, 2021  3:14 PM. If you have any questions, ask your nurse  or doctor.  ?  ?  ? ?  ? ?hydrochlorothiazide 25 MG tablet ?Commonly known as: HYDRODIURIL ?Take 1 tablet (25 mg total) by mouth daily. ?  ? ?  ? ? ? ?Objective:  ? ?BP (!) 144/82   Pulse 62   Ht 5\' 7"  (1.702 m)   Wt 164 lb (74.4 kg)   SpO2 100%   BMI 25.69 kg/m?   ?Wt Readings from Last 3 Encounters:  ?10/02/21 164 lb (74.4 kg)  ?09/26/20 159 lb (72.1 kg)  ?09/14/19 160 lb 12.8 oz (72.9 kg)  ?  ?Physical Exam ?Vitals reviewed.  ?Constitutional:   ?   General: He is not in acute distress. ?   Appearance: He is well-developed. He is not diaphoretic.  ?HENT:  ?   Right Ear: External ear normal.  ?   Left Ear: External ear normal.  ?   Nose: Nose normal.  ?   Mouth/Throat:  ?   Pharynx: No oropharyngeal exudate.  ?Eyes:  ?   General: No scleral icterus.    ?   Right eye: No discharge.  ?   Conjunctiva/sclera: Conjunctivae normal.  ?   Pupils: Pupils are equal, round, and reactive to light.  ?Neck:  ?   Thyroid: No thyromegaly.  ?Cardiovascular:  ?   Rate and Rhythm: Normal rate and regular rhythm.  ?   Heart sounds: Normal heart sounds. No murmur heard. ?Pulmonary:  ?   Effort: Pulmonary effort is normal. No respiratory distress.  ?  Breath sounds: Normal breath sounds. No wheezing.  ?Abdominal:  ?   General: Bowel sounds are normal. There is no distension.  ?   Palpations: Abdomen is soft.  ?   Tenderness: There is no abdominal tenderness. There is no guarding or rebound.  ?Musculoskeletal:     ?   General: Normal range of motion.  ?   Cervical back: Neck supple.  ?Lymphadenopathy:  ?   Cervical: No cervical adenopathy.  ?Skin: ?   General: Skin is warm and dry.  ?   Findings: No rash.  ?Neurological:  ?   Mental Status: He is alert and oriented to person, place, and time.  ?   Coordination: Coordination normal.  ?Psychiatric:     ?   Behavior: Behavior normal.  ? ? ? ? ?Assessment & Plan:  ? ?Problem List Items Addressed This Visit   ? ?  ? Cardiovascular and Mediastinum  ? Essential hypertension, benign  ?  Relevant Medications  ? hydrochlorothiazide (HYDRODIURIL) 25 MG tablet  ? ?Other Visit Diagnoses   ? ? Well adult exam    -  Primary  ? ?  ?Patient had blood work done through his workplace which shows a glucose of 105 which is borderline and testosterone levels are borderline as well at 29.  His vitamin D was normal.  His lipo profile was normal.  His hemoglobin and platelets and white blood count were normal.  His creatinine and electrolytes and lipid were normal.  And his liver function was normal.  His thyroid was normal.  His A1c was 5.5 ?Blood pressure up slightly, he did restart his medication, he did admit that he was taking some supplement that he thinks was taking it up and he since stopped that supplement. ?Follow up plan: ?Return in about 1 year (around 10/03/2022), or if symptoms worsen or fail to improve, for Physical and hypertension. ? ?Counseling provided for all of the vaccine components ?No orders of the defined types were placed in this encounter. ? ? ?Arville Care, MD ?[facility] ?10/02/2021, 3:14 PM ? ? ? ? ?

## 2021-12-01 ENCOUNTER — Other Ambulatory Visit: Payer: Self-pay | Admitting: Family Medicine

## 2021-12-01 DIAGNOSIS — I10 Essential (primary) hypertension: Secondary | ICD-10-CM

## 2021-12-09 ENCOUNTER — Other Ambulatory Visit: Payer: Self-pay | Admitting: Family Medicine

## 2021-12-09 DIAGNOSIS — I10 Essential (primary) hypertension: Secondary | ICD-10-CM

## 2022-10-08 ENCOUNTER — Ambulatory Visit (INDEPENDENT_AMBULATORY_CARE_PROVIDER_SITE_OTHER): Payer: 59 | Admitting: Family Medicine

## 2022-10-08 ENCOUNTER — Encounter: Payer: Self-pay | Admitting: Family Medicine

## 2022-10-08 VITALS — BP 128/79 | HR 78 | Ht 67.0 in | Wt 161.0 lb

## 2022-10-08 DIAGNOSIS — I10 Essential (primary) hypertension: Secondary | ICD-10-CM | POA: Diagnosis not present

## 2022-10-08 DIAGNOSIS — Z0001 Encounter for general adult medical examination with abnormal findings: Secondary | ICD-10-CM

## 2022-10-08 DIAGNOSIS — Z0000 Encounter for general adult medical examination without abnormal findings: Secondary | ICD-10-CM

## 2022-10-08 MED ORDER — HYDROCHLOROTHIAZIDE 25 MG PO TABS: 25.0000 mg | ORAL_TABLET | Freq: Every day | ORAL | 3 refills | Status: DC

## 2022-10-08 NOTE — Progress Notes (Signed)
BP 128/79   Pulse 78   Ht 5\' 7"  (1.702 m)   Wt 73 kg   SpO2 99%   BMI 25.22 kg/m    Subjective:   Patient ID: Dylan Ortiz, male    DOB: [birth year], 57 y.o.   MRN: [id number]  HPI: CORDON LARI is a 57 y.o. male presenting on 10/08/2022 for Annual Physical Exam and Medical Management of Chronic Issues (CPE), Hypertension.  Patient is presenting for annual physical exam. Patient reports he has been staying active by doing sprint-walk workouts every other day. Patient maintains a keto diet and drinks mostly water. Patient states he does a 3-day fast once per month. Patient denies any respiratory, cardio, musculoskeletal, or urinary symptoms. Patient states he has chronic sinuses and uses Flonase daily and a natural supplement called Antronex to help with breathing. Patient also wears nasal strips to help with breathing. Patient reports taking many natural supplements and reports no concerns with them.   Patient denies any chest pain, shortness of breath, headaches or vision issues, abdominal complaints, diarrhea, nausea, vomiting, or joint issues.   Hypertension Patient is coming in for a recheck of their hypertension. Patient is currently taking HCTZ. Patient's blood pressure today is 128/79. Patient denies lightheadedness, dizziness, weakness, headaches, blurred vision, chest pains, and shortness of breath. Patient denies any side effects from medication(s) and is content with current medication(s).   Relevant past medical, surgical, family and social history were reviewed and updated as indicated. Interim medical history were reviewed. Allergies and medications were reviewed and updated.  Review of Systems  Constitutional:  Negative for chills and fever.  HENT:  Negative for congestion, sinus pressure, sinus pain and sore throat.   Cardiovascular:  Negative for chest pain and leg swelling.  Gastrointestinal:  Negative for abdominal pain, constipation, diarrhea, nausea and vomiting.   Genitourinary:  Negative for dysuria, flank pain, frequency and testicular pain.  Musculoskeletal:  Negative for back pain and gait problem.  Skin:  Negative for rash.  Neurological:  Negative for dizziness, weakness, light-headedness and headaches.  Psychiatric/Behavioral:  Negative for behavioral problems and sleep disturbance.     Per HPI unless specifically indicated above.   Allergies as of 10/08/2022   No Known Allergies      Medication List        Accurate as of October 08, 2022  2:09 PM. If you have any questions, ask your nurse or doctor.          hydrochlorothiazide 25 MG tablet Commonly known as: HYDRODIURIL Take 1 tablet (25 mg total) by mouth daily.         Objective:   BP 128/79   Pulse 78   Ht 5\' 7"  (1.702 m)   Wt 73 kg   SpO2 99%   BMI 25.22 kg/m   Wt Readings from Last 3 Encounters:  10/08/22 73 kg  10/02/21 74.4 kg  09/26/20 72.1 kg    Physical Exam Vitals reviewed.  Constitutional:      Appearance: Normal appearance. He is not ill-appearing or diaphoretic.  HENT:     Head: Normocephalic and atraumatic.     Right Ear: Tympanic membrane, ear canal and external ear normal.     Left Ear: Tympanic membrane, ear canal and external ear normal.     Nose: Nose normal.     Mouth/Throat:     Mouth: Mucous membranes are moist.     Pharynx: Oropharynx is clear.  Eyes:     General:  No scleral icterus.    Conjunctiva/sclera: Conjunctivae normal.     Pupils: Pupils are equal, round, and reactive to light.  Neck:     Thyroid: No thyromegaly.  Cardiovascular:     Rate and Rhythm: Normal rate and regular rhythm.     Heart sounds: Normal heart sounds.  Pulmonary:     Effort: Pulmonary effort is normal.     Breath sounds: Normal breath sounds.  Abdominal:     General: Abdomen is flat. There is no distension.     Palpations: Abdomen is soft.     Tenderness: There is no abdominal tenderness. There is no right CVA tenderness, left CVA tenderness or  guarding.  Musculoskeletal:     Cervical back: Neck supple. No tenderness.     Right lower leg: No edema.     Left lower leg: No edema.  Lymphadenopathy:     Cervical: No cervical adenopathy.  Skin:    General: Skin is warm and dry.  Neurological:     Mental Status: He is alert and oriented to person, place, and time.     Gait: Gait normal.  Psychiatric:        Mood and Affect: Mood normal.        Behavior: Behavior normal.     Assessment & Plan:   Problem List Items Addressed This Visit       Cardiovascular and Mediastinum   Essential hypertension, benign   Relevant Medications   hydrochlorothiazide (HYDRODIURIL) 25 MG tablet   Other Visit Diagnoses     Physical exam    -  Primary       Patient denies prostate exam today. Patient reports doing labs in August 2023 with [facility] and received a health report. No acute concerns. A1c was 5.3. LDL was low at 59 and HDL was high at 79. Total cholesterol was normal. Calcium was normal. PSA was normal. Glucose was normal at 96. Liver function was normal. BUN slightly elevated at 34. Creatinine normal. Labs did not test for TSH, CBC values (hemoglobin, platelets, WBC, etc).   Patient states he would be okay with seeing an ENT for his chronic sinusitis, so referral was placed.  Follow up plan: Return in about 1 year (around 10/08/2023), or if symptoms worsen or fail to improve, for Physical and hypertension.  Counseling provided for all of the vaccine components No orders of the defined types were placed in this encounter.   [address], PA-S2 [place] 10/08/2022, 2:09 PM   Patient seen and examined with PA student, agree with assessment plan above [facility] 10/08/2022, 2:09 PM

## 2023-03-16 LAB — LAB REPORT - SCANNED: A1c: 5.5

## 2023-06-21 ENCOUNTER — Encounter: Payer: Self-pay | Admitting: Nurse Practitioner

## 2023-06-21 ENCOUNTER — Ambulatory Visit (INDEPENDENT_AMBULATORY_CARE_PROVIDER_SITE_OTHER): Payer: 59 | Admitting: Nurse Practitioner

## 2023-06-21 VITALS — BP 140/77 | Temp 97.7°F | Resp 20 | Ht 67.0 in | Wt 164.0 lb

## 2023-06-21 DIAGNOSIS — M545 Low back pain, unspecified: Secondary | ICD-10-CM

## 2023-06-21 DIAGNOSIS — K641 Second degree hemorrhoids: Secondary | ICD-10-CM | POA: Diagnosis not present

## 2023-06-21 MED ORDER — METHYLPREDNISOLONE ACETATE 80 MG/ML IJ SUSP
80.0000 mg | Freq: Once | INTRAMUSCULAR | Status: AC
  Administered 2023-06-21: 80 mg via INTRAMUSCULAR

## 2023-06-21 MED ORDER — KETOROLAC TROMETHAMINE 60 MG/2ML IM SOLN
60.0000 mg | Freq: Once | INTRAMUSCULAR | Status: AC
  Administered 2023-06-21: 60 mg via INTRAMUSCULAR

## 2023-06-21 MED ORDER — PREDNISONE 20 MG PO TABS: ORAL_TABLET | ORAL | 0 refills | Status: AC

## 2023-06-21 MED ORDER — HYDROCORTISONE (PERIANAL) 2.5 % EX CREA: 1.0000 | TOPICAL_CREAM | Freq: Two times a day (BID) | CUTANEOUS | 0 refills | Status: AC

## 2023-06-21 NOTE — Progress Notes (Signed)
   Subjective:    Patient ID: Dylan Ortiz, male    DOB: May 02, 1966, 57 y.o.   MRN: [id number]   Chief Complaint: Back Pain and Hemorrhoids   Back Pain This is a recurrent problem. The current episode started in the past 7 days. The problem occurs constantly. The pain is present in the sacro-iliac. The quality of the pain is described as stabbing. The pain does not radiate. The pain is at a severity of 4/10. The pain is moderate. The pain is Worse during the night. The symptoms are aggravated by lying down. Stiffness is present In the morning. He has tried NSAIDs for the symptoms. The treatment provided mild relief.   Hamorrhoids- have had for over a month- he has used OTC cream and witchhazel.   Patient Active Problem List   Diagnosis Date Noted   Essential hypertension, benign 07/11/2015       Review of Systems  Musculoskeletal:  Positive for back pain.       Objective:   Physical Exam Constitutional:      Appearance: Normal appearance.  Cardiovascular:     Rate and Rhythm: Normal rate and regular rhythm.  Pulmonary:     Breath sounds: Normal breath sounds.  Abdominal:     Tenderness: There is no left CVA tenderness.  Musculoskeletal:     Comments: FROM of lumbar spine with pain on rotation and extension (-) SLR Motor strength and sensation intact distally.  Skin:    General: Skin is warm.  Neurological:     General: No focal deficit present.     Mental Status: He is alert and oriented to person, place, and time.       BP (!) 140/77   Temp 97.7 F (36.5 C) (Temporal)   Resp 20   Ht 5\' 7"  (1.702 m)   Wt 164 lb (74.4 kg)   BMI 25.69 kg/m      Assessment & Plan:   Dylan Ortiz in today with chief complaint of Back Pain and Hemorrhoids   1. Acute midline low back pain without sciatica Back stretches Moist heat No bending or stooping for 2-3 days - ketorolac (TORADOL) injection 60 mg - methylPREDNISolone acetate (DEPO-MEDROL) injection 80 mg -  predniSONE (DELTASONE) 20 MG tablet; 2 po at sametime daily for 5 days-  Dispense: 10 tablet; Refill: 0  2. Hemorrhoids Comfrey paste OTC Stool softner If not improving will do referral to general surgeon  The above assessment and management plan was discussed with the patient. The patient verbalized understanding of and has agreed to the management plan. Patient is aware to call the clinic if symptoms persist or worsen. Patient is aware when to return to the clinic for a follow-up visit. Patient educated on when it is appropriate to go to the emergency department.   Mary-Margaret Daphine Deutscher, FNP

## 2023-06-21 NOTE — Patient Instructions (Signed)
Acute Back Pain, Adult Acute back pain is sudden and usually short-lived. It is often caused by an injury to the muscles and tissues in the back. The injury may result from: A muscle, tendon, or ligament getting overstretched or torn. Ligaments are tissues that connect bones to each other. Lifting something improperly can cause a back strain. Wear and tear (degeneration) of the spinal disks. Spinal disks are circular tissue that provide cushioning between the bones of the spine (vertebrae). Twisting motions, such as while playing sports or doing yard work. A hit to the back. Arthritis. You may have a physical exam, lab tests, and imaging tests to find the cause of your pain. Acute back pain usually goes away with rest and home care. Follow these instructions at home: Managing pain, stiffness, and swelling Take over-the-counter and prescription medicines only as told by your health care provider. Treatment may include medicines for pain and inflammation that are taken by mouth or applied to the skin, or muscle relaxants. Your health care provider may recommend applying ice during the first 24-48 hours after your pain starts. To do this: Put ice in a plastic bag. Place a towel between your skin and the bag. Leave the ice on for 20 minutes, 2-3 times a day. Remove the ice if your skin turns bright red. This is very important. If you cannot feel pain, heat, or cold, you have a greater risk of damage to the area. If directed, apply heat to the affected area as often as told by your health care provider. Use the heat source that your health care provider recommends, such as a moist heat pack or a heating pad. Place a towel between your skin and the heat source. Leave the heat on for 20-30 minutes. Remove the heat if your skin turns bright red. This is especially important if you are unable to feel pain, heat, or cold. You have a greater risk of getting burned. Activity  Do not stay in bed. Staying in  bed for more than 1-2 days can delay your recovery. Sit up and stand up straight. Avoid leaning forward when you sit or hunching over when you stand. If you work at a desk, sit close to it so you do not need to lean over. Keep your chin tucked in. Keep your neck drawn back, and keep your elbows bent at a 90-degree angle (right angle). Sit high and close to the steering wheel when you drive. Add lower back (lumbar) support to your car seat, if needed. Take short walks on even surfaces as soon as you are able. Try to increase the length of time you walk each day. Do not sit, drive, or stand in one place for more than 30 minutes at a time. Sitting or standing for long periods of time can put stress on your back. Do not drive or use heavy machinery while taking prescription pain medicine. Use proper lifting techniques. When you bend and lift, use positions that put less stress on your back: Bend your knees. Keep the load close to your body. Avoid twisting. Exercise regularly as told by your health care provider. Exercising helps your back heal faster and helps prevent back injuries by keeping muscles strong and flexible. Work with a physical therapist to make a safe exercise program, as recommended by your health care provider. Do any exercises as told by your physical therapist. Lifestyle Maintain a healthy weight. Extra weight puts stress on your back and makes it difficult to have good   posture. Avoid activities or situations that make you feel anxious or stressed. Stress and anxiety increase muscle tension and can make back pain worse. Learn ways to manage anxiety and stress, such as through exercise. General instructions Sleep on a firm mattress in a comfortable position. Try lying on your side with your knees slightly bent. If you lie on your back, put a pillow under your knees. Keep your head and neck in a straight line with your spine (neutral position) when using electronic equipment like  smartphones or pads. To do this: Raise your smartphone or pad to look at it instead of bending your head or neck to look down. Put the smartphone or pad at the level of your face while looking at the screen. Follow your treatment plan as told by your health care provider. This may include: Cognitive or behavioral therapy. Acupuncture or massage therapy. Meditation or yoga. Contact a health care provider if: You have pain that is not relieved with rest or medicine. You have increasing pain going down into your legs or buttocks. Your pain does not improve after 2 weeks. You have pain at night. You lose weight without trying. You have a fever or chills. You develop nausea or vomiting. You develop abdominal pain. Get help right away if: You develop new bowel or bladder control problems. You have unusual weakness or numbness in your arms or legs. You feel faint. These symptoms may represent a serious problem that is an emergency. Do not wait to see if the symptoms will go away. Get medical help right away. Call your local emergency services (911 in the [place] Do not drive yourself to the hospital. Summary Acute back pain is sudden and usually short-lived. Use proper lifting techniques. When you bend and lift, use positions that put less stress on your back. Take over-the-counter and prescription medicines only as told by your health care provider, and apply heat or ice as told. This information is not intended to replace advice given to you by your health care provider. Make sure you discuss any questions you have with your health care provider. Document Revised: 09/26/2020 Document Reviewed: 09/26/2020 [employer]vier Patient Education  2024 [employer]  

## 2023-10-04 ENCOUNTER — Other Ambulatory Visit: Payer: Self-pay | Admitting: Family Medicine

## 2023-10-04 DIAGNOSIS — I10 Essential (primary) hypertension: Secondary | ICD-10-CM

## 2023-10-14 ENCOUNTER — Encounter: Payer: Self-pay | Admitting: Family Medicine

## 2023-10-14 ENCOUNTER — Ambulatory Visit: Payer: 59 | Admitting: Family Medicine

## 2023-10-14 VITALS — BP 156/87 | HR 64 | Temp 97.5°F | Ht 67.0 in | Wt 165.4 lb

## 2023-10-14 DIAGNOSIS — I10 Essential (primary) hypertension: Secondary | ICD-10-CM | POA: Diagnosis not present

## 2023-10-14 DIAGNOSIS — Z0001 Encounter for general adult medical examination with abnormal findings: Secondary | ICD-10-CM

## 2023-10-14 DIAGNOSIS — Z0000 Encounter for general adult medical examination without abnormal findings: Secondary | ICD-10-CM

## 2023-10-14 MED ORDER — HYDROCHLOROTHIAZIDE 25 MG PO TABS: 25.0000 mg | ORAL_TABLET | Freq: Every day | ORAL | 3 refills | Status: AC

## 2023-10-14 NOTE — Progress Notes (Signed)
 BP (!) 156/87   Pulse 64   Temp (!) 97.5 F (36.4 C) (Temporal)   Ht 5\' 7"  (1.702 m)   Wt 165 lb 6.4 oz (75 kg)   SpO2 100%   BMI 25.91 kg/m    Subjective:   Patient ID: Dylan Ortiz, male    DOB: [birth year], 58 y.o.   MRN: [id number]  HPI: Dylan Ortiz is a 58 y.o. male presenting on 10/14/2023 for Medical Management of Chronic Issues (CPE) and Hypertension   HPI Physical exam Patient denies any chest pain, shortness of breath, headaches or vision issues, abdominal complaints, diarrhea, nausea, vomiting, or joint issues.   Hypertension Patient is currently on hydrochlorothiazide, and their blood pressure today is 133/77. Patient denies any lightheadedness or dizziness. Patient denies headaches, blurred vision, chest pains, shortness of breath, or weakness. Denies any side effects from medication and is content with current medication.   Relevant past medical, surgical, family and social history reviewed and updated as indicated. Interim medical history since our last visit reviewed. Allergies and medications reviewed and updated.  Review of Systems  Constitutional:  Negative for chills and fever.  HENT:  Negative for ear pain and tinnitus.   Eyes:  Negative for pain and visual disturbance.  Respiratory:  Negative for cough, shortness of breath and wheezing.   Cardiovascular:  Negative for chest pain, palpitations and leg swelling.  Gastrointestinal:  Negative for abdominal pain, blood in stool, constipation and diarrhea.  Genitourinary:  Negative for dysuria and hematuria.  Musculoskeletal:  Negative for back pain, gait problem and myalgias.  Skin:  Negative for rash.  Neurological:  Negative for dizziness, weakness, light-headedness and headaches.  Psychiatric/Behavioral:  Negative for suicidal ideas.   All other systems reviewed and are negative.   Per HPI unless specifically indicated above   Allergies as of 10/14/2023   No Known Allergies      Medication List         Accurate as of October 14, 2023  3:12 PM. If you have any questions, ask your nurse or doctor.          hydrochlorothiazide 25 MG tablet Commonly known as: HYDRODIURIL Take 1 tablet (25 mg total) by mouth daily.   hydrocortisone 2.5 % rectal cream Commonly known as: Anusol-HC Place 1 Application rectally 2 (two) times daily.   predniSONE 20 MG tablet Commonly known as: DELTASONE 2 po at sametime daily for 5 days-         Objective:   BP (!) 156/87   Pulse 64   Temp (!) 97.5 F (36.4 C) (Temporal)   Ht 5\' 7"  (1.702 m)   Wt 165 lb 6.4 oz (75 kg)   SpO2 100%   BMI 25.91 kg/m   Wt Readings from Last 3 Encounters:  10/14/23 165 lb 6.4 oz (75 kg)  06/21/23 164 lb (74.4 kg)  10/08/22 161 lb (73 kg)    Physical Exam Vitals reviewed.  Constitutional:      General: He is not in acute distress.    Appearance: He is well-developed. He is not diaphoretic.  HENT:     Right Ear: External ear normal.     Left Ear: External ear normal.     Nose: Nose normal.     Mouth/Throat:     Pharynx: No oropharyngeal exudate.  Eyes:     General: No scleral icterus.       Right eye: No discharge.     Conjunctiva/sclera: Conjunctivae normal.  Pupils: Pupils are equal, round, and reactive to light.  Neck:     Thyroid: No thyromegaly.  Cardiovascular:     Rate and Rhythm: Normal rate and regular rhythm.     Heart sounds: Normal heart sounds. No murmur heard. Pulmonary:     Effort: Pulmonary effort is normal. No respiratory distress.     Breath sounds: Normal breath sounds. No wheezing.  Abdominal:     General: Bowel sounds are normal. There is no distension.     Palpations: Abdomen is soft.     Tenderness: There is no abdominal tenderness. There is no guarding or rebound.  Musculoskeletal:        General: Normal range of motion.     Cervical back: Neck supple.  Lymphadenopathy:     Cervical: No cervical adenopathy.  Skin:    General: Skin is warm and dry.      Findings: No rash.  Neurological:     Mental Status: He is alert and oriented to person, place, and time.     Coordination: Coordination normal.  Psychiatric:        Behavior: Behavior normal.       Assessment & Plan:   Problem List Items Addressed This Visit       Cardiovascular and Mediastinum   Essential hypertension, benign   Relevant Medications   hydrochlorothiazide (HYDRODIURIL) 25 MG tablet   Other Visit Diagnoses       Physical exam    -  Primary       Continue hydrochlorothiazide.  Blood work looks good from his workplace, will scanned into his chart. Follow up plan: Return in about 1 year (around 10/13/2024), or if symptoms worsen or fail to improve, for Exam Physical .  Counseling provided for all of the vaccine components No orders of the defined types were placed in this encounter.   Arville Care, MD [facility] 10/14/2023, 3:12 PM

## 2023-11-25 ENCOUNTER — Ambulatory Visit: Admitting: Family Medicine

## 2024-10-15 ENCOUNTER — Encounter: Payer: Self-pay | Admitting: Family Medicine
